# Patient Record
Sex: Male | Born: 1935 | Race: Black or African American | Hispanic: No | Marital: Single
Health system: Southern US, Community
[De-identification: ages and names within clinical notes are randomized; demographics above are authoritative.]

## PROBLEM LIST (undated history)

## (undated) DIAGNOSIS — R55 Syncope and collapse: Secondary | ICD-10-CM

## (undated) DIAGNOSIS — I639 Cerebral infarction, unspecified: Secondary | ICD-10-CM

## (undated) DIAGNOSIS — F0280 Dementia in other diseases classified elsewhere without behavioral disturbance: Secondary | ICD-10-CM

---

## 2019-09-28 ENCOUNTER — Inpatient Hospital Stay (HOSPITAL_COMMUNITY)
Admission: EM | Admit: 2019-09-28 | Discharge: 2019-10-02 | DRG: 683 | Disposition: A | Discharge status: Home Health | Payer: Medicare Other | Attending: Internal Medicine | Admitting: Internal Medicine

## 2019-09-28 ENCOUNTER — Inpatient Hospital Stay (HOSPITAL_COMMUNITY): Payer: Medicare Other

## 2019-09-28 ENCOUNTER — Encounter (HOSPITAL_COMMUNITY): Payer: Self-pay | Admitting: Emergency Medicine

## 2019-09-28 ENCOUNTER — Other Ambulatory Visit: Payer: Self-pay

## 2019-09-28 DIAGNOSIS — R197 Diarrhea, unspecified: Secondary | ICD-10-CM | POA: Diagnosis present

## 2019-09-28 DIAGNOSIS — F0280 Dementia in other diseases classified elsewhere without behavioral disturbance: Secondary | ICD-10-CM | POA: Diagnosis present

## 2019-09-28 DIAGNOSIS — E860 Dehydration: Secondary | ICD-10-CM | POA: Diagnosis present

## 2019-09-28 DIAGNOSIS — I69351 Hemiplegia and hemiparesis following cerebral infarction affecting right dominant side: Secondary | ICD-10-CM

## 2019-09-28 DIAGNOSIS — G309 Alzheimer's disease, unspecified: Secondary | ICD-10-CM | POA: Diagnosis present

## 2019-09-28 DIAGNOSIS — Z20822 Contact with and (suspected) exposure to covid-19: Secondary | ICD-10-CM | POA: Diagnosis present

## 2019-09-28 DIAGNOSIS — N281 Cyst of kidney, acquired: Secondary | ICD-10-CM | POA: Diagnosis present

## 2019-09-28 DIAGNOSIS — R509 Fever, unspecified: Secondary | ICD-10-CM | POA: Diagnosis not present

## 2019-09-28 DIAGNOSIS — N390 Urinary tract infection, site not specified: Secondary | ICD-10-CM | POA: Diagnosis present

## 2019-09-28 DIAGNOSIS — N1832 Chronic kidney disease, stage 3b: Secondary | ICD-10-CM | POA: Diagnosis present

## 2019-09-28 DIAGNOSIS — R55 Syncope and collapse: Secondary | ICD-10-CM | POA: Diagnosis present

## 2019-09-28 DIAGNOSIS — E870 Hyperosmolality and hypernatremia: Secondary | ICD-10-CM | POA: Diagnosis present

## 2019-09-28 DIAGNOSIS — N179 Acute kidney failure, unspecified: Secondary | ICD-10-CM | POA: Diagnosis present

## 2019-09-28 DIAGNOSIS — E872 Acidosis: Secondary | ICD-10-CM | POA: Diagnosis present

## 2019-09-28 DIAGNOSIS — B964 Proteus (mirabilis) (morganii) as the cause of diseases classified elsewhere: Secondary | ICD-10-CM | POA: Diagnosis present

## 2019-09-28 DIAGNOSIS — E876 Hypokalemia: Secondary | ICD-10-CM | POA: Diagnosis present

## 2019-09-28 DIAGNOSIS — D649 Anemia, unspecified: Secondary | ICD-10-CM | POA: Diagnosis not present

## 2019-09-28 HISTORY — DX: Syncope and collapse: R55

## 2019-09-28 HISTORY — DX: Dementia in other diseases classified elsewhere, unspecified severity, without behavioral disturbance, psychotic disturbance, mood disturbance, and anxiety: F02.80

## 2019-09-28 HISTORY — DX: Cerebral infarction, unspecified: I63.9

## 2019-09-28 LAB — BASIC METABOLIC PANEL
Anion gap: 13 (ref 5–15)
BUN: 40 mg/dL — ABNORMAL HIGH (ref 8–23)
BUN: 41 mg/dL — ABNORMAL HIGH (ref 8–23)
CO2: 26 mmol/L (ref 22–32)
CO2: 21 mmol/L — ABNORMAL LOW (ref 22–32)
Calcium: 9.2 mg/dL (ref 8.9–10.3)
Calcium: 8.9 mg/dL (ref 8.9–10.3)
Chloride: >130 mmol/L (ref 98–111)
Chloride: 112 mmol/L — ABNORMAL HIGH (ref 98–111)
Creatinine, Ser: 2.31 mg/dL — ABNORMAL HIGH (ref 0.61–1.24)
Creatinine, Ser: 2.39 mg/dL — ABNORMAL HIGH (ref 0.61–1.24)
GFR calc Af Amer: 27 mL/min — ABNORMAL LOW (ref >60)
GFR calc Af Amer: 26 mL/min — ABNORMAL LOW (ref >60)
GFR calc non Af Amer: 23 mL/min — ABNORMAL LOW (ref >60)
GFR calc non Af Amer: 23 mL/min — ABNORMAL LOW (ref >60)
Glucose, Bld: 126 mg/dL — ABNORMAL HIGH (ref 70–99)
Glucose, Bld: 111 mg/dL — ABNORMAL HIGH (ref 70–99)
Potassium: 4.8 mmol/L (ref 3.5–5.1)
Potassium: 4.7 mmol/L (ref 3.5–5.1)
Sodium: 174 mmol/L (ref 135–145)
Sodium: 146 mmol/L — ABNORMAL HIGH (ref 135–145)

## 2019-09-28 LAB — CBC WITH DIFFERENTIAL/PLATELET
Abs Immature Granulocytes: 0.05 10*3/uL (ref 0.00–0.07)
Basophils Absolute: 0 10*3/uL (ref 0.0–0.1)
Basophils Relative: 0 %
Eosinophils Absolute: 0 10*3/uL (ref 0.0–0.5)
Eosinophils Relative: 0 %
HCT: 34.9 % — ABNORMAL LOW (ref 39.0–52.0)
Hemoglobin: 11.2 g/dL — ABNORMAL LOW (ref 13.0–17.0)
Immature Granulocytes: 0 %
Lymphocytes Relative: 8 %
Lymphs Abs: 0.9 10*3/uL (ref 0.7–4.0)
MCH: 31.4 pg (ref 26.0–34.0)
MCHC: 32.1 g/dL (ref 30.0–36.0)
MCV: 97.8 fL (ref 80.0–100.0)
Monocytes Absolute: 0.6 10*3/uL (ref 0.1–1.0)
Monocytes Relative: 5 %
Neutro Abs: 10.4 10*3/uL — ABNORMAL HIGH (ref 1.7–7.7)
Neutrophils Relative %: 87 %
Platelets: 251 10*3/uL (ref 150–400)
RBC: 3.57 MIL/uL — ABNORMAL LOW (ref 4.22–5.81)
RDW: 12.6 % (ref 11.5–15.5)
WBC: 12 10*3/uL — ABNORMAL HIGH (ref 4.0–10.5)
nRBC: 0 % (ref 0.0–0.2)

## 2019-09-28 LAB — RESPIRATORY PANEL BY RT PCR (FLU A&B, COVID)
Influenza A by PCR: NEGATIVE
Influenza B by PCR: NEGATIVE
SARS Coronavirus 2 by RT PCR: NEGATIVE

## 2019-09-28 LAB — GLUCOSE, CAPILLARY
Glucose-Capillary: 86 mg/dL (ref 70–99)
Glucose-Capillary: 92 mg/dL (ref 70–99)

## 2019-09-28 MED ORDER — SODIUM CHLORIDE 0.9 % IV BOLUS
500.0000 mL | Freq: Once | INTRAVENOUS | Status: AC
  Administered 2019-09-28: 500 mL via INTRAVENOUS

## 2019-09-28 MED ORDER — ACETAMINOPHEN 650 MG RE SUPP
650.0000 mg | Freq: Four times a day (QID) | RECTAL | Status: DC | PRN
  Administered 2019-09-29: 650 mg via RECTAL
  Filled 2019-09-28: qty 1

## 2019-09-28 MED ORDER — SODIUM CHLORIDE 0.9% FLUSH
3.0000 mL | Freq: Two times a day (BID) | INTRAVENOUS | Status: DC
  Administered 2019-09-30 – 2019-10-01 (×4): 3 mL via INTRAVENOUS

## 2019-09-28 MED ORDER — SODIUM CHLORIDE 0.9 % IV SOLN: INTRAVENOUS | Status: DC

## 2019-09-28 MED ORDER — ACETAMINOPHEN 325 MG PO TABS: 650.0000 mg | ORAL_TABLET | Freq: Four times a day (QID) | ORAL | Status: DC | PRN

## 2019-09-28 MED ORDER — HEPARIN SODIUM (PORCINE) 5000 UNIT/ML IJ SOLN: 5000.0000 [IU] | Freq: Three times a day (TID) | INTRAMUSCULAR | Status: DC

## 2019-09-28 MED ORDER — HEPARIN SODIUM (PORCINE) 5000 UNIT/ML IJ SOLN
5000.0000 [IU] | Freq: Two times a day (BID) | INTRAMUSCULAR | Status: DC
  Administered 2019-09-28 – 2019-10-02 (×8): 5000 [IU] via SUBCUTANEOUS
  Filled 2019-09-28 (×8): qty 1

## 2019-09-28 NOTE — ED Triage Notes (Signed)
Per [facility] pts family reports patient had syncopal episode after having Covid vaccine. Family states patient was unresponsive for about 20 mins and that pt has hx vagal maneuver and has happened in past. When EMS arrived patient was alert. Hx of stroke with right sided deficits and speech issues. Patient has memory impairment and says "yes" to every question asked.

## 2019-09-28 NOTE — ED Notes (Signed)
ED TO INPATIENT HANDOFF REPORT  ED Nurse Name and Phone #: Florentina Addison [phone]  S Name/Age/Gender Rodney Perkins 86 y.o. male Room/Bed: 034C/034C  Code Status   Code Status: Full Code  Home/SNF/Other Home Patient oriented to: self Is this baseline? Yes   Triage Complete: Triage complete  Chief Complaint AKI (acute kidney injury) (HCC) [N17.9] Syncope [R55]  Triage Note Per [facility] pts family reports patient had syncopal episode after having Covid vaccine. Family states patient was unresponsive for about 20 mins and that pt has hx vagal maneuver and has happened in past. When EMS arrived patient was alert. Hx of stroke with right sided deficits and speech issues. Patient has memory impairment and says "yes" to every question asked.     Allergies No Known Allergies  Level of Care/Admitting Diagnosis ED Disposition    ED Disposition Condition Comment   Admit  Hospital Area: [facility] [[id number]]  Level of Care: Telemetry Medical [104]  May admit patient to [facility] or [facility] if equivalent level of care is available:: Yes  Covid Evaluation: Asymptomatic Screening Protocol (No Symptoms)  Diagnosis: Syncope [206001]  Admitting Physician: Emeline General [[id number]]  Attending Physician: Emeline General [[id number]]  Estimated length of stay: past midnight tomorrow  Certification:: I certify this patient will need inpatient services for at least 2 midnights       B Medical/Surgery History Past Medical History:  Diagnosis Date  . Alzheimer's dementia (HCC)   . Stroke (HCC)   . Vagal reaction      A IV Location/Drains/Wounds Patient Lines/Drains/Airways Status   Active Line/Drains/Airways    Name:   Placement date:   Placement time:   Site:   Days:   Peripheral IV 09/28/19 Right Hand   09/28/19    1156    Hand   less than 1          Intake/Output Last 24 hours  Intake/Output Summary (Last 24 hours) at 09/28/2019 1806 Last data filed at 09/28/2019  1410 Gross per 24 hour  Intake 500 ml  Output --  Net 500 ml    Labs/Imaging Results for orders placed or performed during the hospital encounter of 09/28/19 (from the past 48 hour(s))  Basic metabolic panel     Status: Abnormal   Collection Time: 09/28/19 11:22 AM  Result Value Ref Range   Sodium 174 (HH) 135 - 145 mmol/L    Comment: CRITICAL RESULT CALLED TO, READ BACK BY AND VERIFIED WITH: K.RAND,RN 1238 09/28/2019 CLARK,S    Potassium 4.8 3.5 - 5.1 mmol/L   Chloride >130 (HH) 98 - 111 mmol/L    Comment: CRITICAL RESULT CALLED TO, READ BACK BY AND VERIFIED WITH: K.RAND,RN 1238 09/28/2019 CLARK,S    CO2 26 22 - 32 mmol/L   Glucose, Bld 126 (H) 70 - 99 mg/dL   BUN 40 (H) 8 - 23 mg/dL   Creatinine, Ser 0.27 (H) 0.61 - 1.24 mg/dL   Calcium 9.2 8.9 - 74.1 mg/dL   GFR calc non Af Amer 23 (L) >60 mL/min   GFR calc Af Amer 27 (L) >60 mL/min   Anion gap NOT CALCULATED 5 - 15    Comment: Performed at [facility], [address] [place], [place] [address]  Basic metabolic panel     Status: Abnormal   Collection Time: 09/28/19  1:35 PM  Result Value Ref Range   Sodium 146 (H) 135 - 145 mmol/L    Comment: DELTA CHECK NOTED  Potassium 4.7 3.5 - 5.1 mmol/L   Chloride 112 (H) 98 - 111 mmol/L   CO2 21 (L) 22 - 32 mmol/L   Glucose, Bld 111 (H) 70 - 99 mg/dL   BUN 41 (H) 8 - 23 mg/dL   Creatinine, Ser 1.59 (H) 0.61 - 1.24 mg/dL   Calcium 8.9 8.9 - 45.8 mg/dL   GFR calc non Af Amer 23 (L) >60 mL/min   GFR calc Af Amer 26 (L) >60 mL/min   Anion gap 13 5 - 15    Comment: Performed at [facility], [address] [place], [place] [address]  CBC with Differential     Status: Abnormal   Collection Time: 09/28/19  1:37 PM  Result Value Ref Range   WBC 12.0 (H) 4.0 - 10.5 K/uL   RBC 3.57 (L) 4.22 - 5.81 MIL/uL   Hemoglobin 11.2 (L) 13.0 - 17.0 g/dL   HCT 44.6 (L) 28.6 - 38.1 %   MCV 97.8 80.0 - 100.0 fL   MCH 31.4 26.0 - 34.0 pg   MCHC 32.1 30.0 - 36.0 g/dL   RDW 77.1  16.5 - 79.0 %   Platelets 251 150 - 400 K/uL   nRBC 0.0 0.0 - 0.2 %   Neutrophils Relative % 87 %   Neutro Abs 10.4 (H) 1.7 - 7.7 K/uL   Lymphocytes Relative 8 %   Lymphs Abs 0.9 0.7 - 4.0 K/uL   Monocytes Relative 5 %   Monocytes Absolute 0.6 0.1 - 1.0 K/uL   Eosinophils Relative 0 %   Eosinophils Absolute 0.0 0.0 - 0.5 K/uL   Basophils Relative 0 %   Basophils Absolute 0.0 0.0 - 0.1 K/uL   Immature Granulocytes 0 %   Abs Immature Granulocytes 0.05 0.00 - 0.07 K/uL    Comment: Performed at [facility], [address] [place], [place] [address]   X-ray chest PA and lateral  Result Date: 09/28/2019 CLINICAL DATA:  Syncopal episode after receiving a COVID vaccine today. EXAM: CHEST - 2 VIEW COMPARISON:  None. FINDINGS: The lungs are clear. Heart size is normal. No pneumothorax or pleural fluid. No acute or focal bony abnormality. IMPRESSION: Negative chest. Electronically Signed   By: Drusilla Kanner M.D.   On: 09/28/2019 17:50    Pending Labs Unresulted Labs (From admission, onward)    Start     Ordered   09/29/19 0500  Basic metabolic panel  Tomorrow morning,   R     09/28/19 1728   09/28/19 1548  Respiratory Panel by RT PCR (Flu A&B, Covid) - Nasopharyngeal Swab  (Asymptomatic Respiratory Panel by RT PCR (Flu A&B, Covid))  Once,   STAT    Question Answer Comment  Is this test for diagnosis or screening Screening   Symptomatic for COVID-19 as defined by CDC No   Hospitalized for COVID-19 No   Admitted to ICU for COVID-19 No   Previously tested for COVID-19 No   Resident in a congregate (group) care setting No   Employed in healthcare setting No      09/28/19 1547   09/28/19 1122  CBC with Differential  Once,   STAT     09/28/19 1121   09/28/19 1122  Urinalysis, Routine w reflex microscopic  ONCE - STAT,   STAT     09/28/19 1121          Vitals/Pain Today's Vitals   09/28/19 1530 09/28/19 1545 09/28/19 1615 09/28/19 1645  BP: (!) 131/98 Marland Kitchen)  137/57 (!) 139/50  118/71  Pulse: 98 98 95 (!) 102  Resp: 14 15 15  (!) 25  Temp:      TempSrc:      SpO2: 95% 97% 97% 98%  PainSc:        Isolation Precautions No active isolations  Medications Medications  sodium chloride 0.9 % bolus 500 mL (has no administration in time range)  acetaminophen (TYLENOL) tablet 650 mg (has no administration in time range)    Or  acetaminophen (TYLENOL) suppository 650 mg (has no administration in time range)  sodium chloride flush (NS) 0.9 % injection 3 mL (has no administration in time range)  heparin injection 5,000 Units (has no administration in time range)  heparin injection 5,000 Units (has no administration in time range)  0.9 %  sodium chloride infusion (has no administration in time range)  sodium chloride 0.9 % bolus 500 mL (0 mLs Intravenous Stopped 09/28/19 1410)    Mobility walks with device     R Recommendations: See Admitting Provider Note  Report given to:

## 2019-09-28 NOTE — ED Provider Notes (Signed)
[facility] EMERGENCY DEPARTMENT Provider Note   CSN: [id number] Arrival date & time: 09/28/19  1101     History Chief Complaint  Patient presents with  . Loss of Consciousness    Rodney Perkins is a 86 y.o. male.  Rodney Perkins is a 86 y.o. male with history of stroke, Alzheimer's, vasovagal syncope, who presents to the emergency department via EMS after a syncopal episode.  Patient was receiving his second Covid vaccine at a clinic and after waiting is 15 minutes when he went to stand up he had a syncopal episode.  Family estimates that it took him 15 to 20 minutes to return to baseline after syncope, but on EMS arrival patient was alert and responsive.  Family reports history of since the last one was about a month and a half ago.  She does state that he usually comes around a bit faster, and 5 to 10 minutes.  Patient is verbally responsive but with dementia he answers yes to every question and is able to provide limited history.  Patient's daughter arrives at bedside and states that over the past few days he has not gotten much rest, but is at his baseline mental status.  Daughter reports that he had an episode of diarrhea this morning which is not uncommon for him but often makes him exhausted throughout the day, he has not had any fevers, chest pain or cough.  Most of his other family members have already been vaccinated for Covid.  She does express concern for possible UTI.  Reports she recently became his primary caregiver after her mother passed away and he moved here from [place], she does not have his previous medical records available.        Past Medical History:  Diagnosis Date  . Alzheimer's dementia (HCC)   . Stroke (HCC)   . Vagal reaction     There are no problems to display for this patient.     No family history on file.  Social History   Tobacco Use  . Smoking status: Not on file  Substance Use Topics  . Alcohol use: Not on file  . Drug  use: Not on file    Home Medications Prior to Admission medications   Not on File    Allergies    Patient has no allergy information on record.  Review of Systems   Review of Systems  Unable to perform ROS: Dementia    Physical Exam Updated Vital Signs BP 117/73   Pulse (!) 103   Temp 98.4 F (36.9 C) (Oral)   Resp 14   SpO2 98%   Physical Exam Vitals and nursing note reviewed.  Constitutional:      General: He is not in acute distress.    Appearance: He is well-developed. He is not diaphoretic.     Comments: Chronically ill-appearing elderly male, in no acute distress  HENT:     Head: Normocephalic and atraumatic.     Mouth/Throat:     Comments: Mucous membranes slightly dry Eyes:     General:        Right eye: No discharge.        Left eye: No discharge.  Cardiovascular:     Rate and Rhythm: Regular rhythm. Tachycardia present.     Heart sounds: Normal heart sounds.     Comments: Mild tachycardia with regular rhythm Pulmonary:     Effort: Pulmonary effort is normal. No respiratory distress.     Breath sounds:  Normal breath sounds. No wheezing or rales.     Comments: Respirations equal and unlabored, patient able to speak in full sentences, lungs clear to auscultation bilaterally Abdominal:     General: Bowel sounds are normal. There is no distension.     Palpations: Abdomen is soft. There is no mass.     Tenderness: There is no abdominal tenderness. There is no guarding.     Comments: Abdomen soft, nondistended, nontender to palpation in all quadrants without guarding or peritoneal signs  Musculoskeletal:        General: No deformity.     Cervical back: Neck supple.  Skin:    General: Skin is warm and dry.     Capillary Refill: Capillary refill takes less than 2 seconds.  Neurological:     Mental Status: He is alert.     Coordination: Coordination normal.     Comments: Patient answers yes to all questions, able to follow some commands Chronic  right-sided deficits noted with contracture, able to move left extremities without difficulty  Psychiatric:        Mood and Affect: Mood normal.        Behavior: Behavior normal.     ED Results / Procedures / Treatments   Labs (all labs ordered are listed, but only abnormal results are displayed) Labs Reviewed  BASIC METABOLIC PANEL - Abnormal; Notable for the following components:      Result Value   Sodium 174 (*)    Chloride >130 (*)    Glucose, Bld 126 (*)    BUN 40 (*)    Creatinine, Ser 2.31 (*)    GFR calc non Af Amer 23 (*)    GFR calc Af Amer 27 (*)    All other components within normal limits  BASIC METABOLIC PANEL - Abnormal; Notable for the following components:   Sodium 146 (*)    Chloride 112 (*)    CO2 21 (*)    Glucose, Bld 111 (*)    BUN 41 (*)    Creatinine, Ser 2.39 (*)    GFR calc non Af Amer 23 (*)    GFR calc Af Amer 26 (*)    All other components within normal limits  CBC WITH DIFFERENTIAL/PLATELET - Abnormal; Notable for the following components:   WBC 12.0 (*)    RBC 3.57 (*)    Hemoglobin 11.2 (*)    HCT 34.9 (*)    Neutro Abs 10.4 (*)    All other components within normal limits  RESPIRATORY PANEL BY RT PCR (FLU A&B, COVID)  CBC WITH DIFFERENTIAL/PLATELET  URINALYSIS, ROUTINE W REFLEX MICROSCOPIC    EKG None  Radiology No results found.  Procedures Procedures (including critical care time)  Medications Ordered in ED Medications  sodium chloride 0.9 % bolus 500 mL (has no administration in time range)  0.9 %  sodium chloride infusion (has no administration in time range)  sodium chloride 0.9 % bolus 500 mL (0 mLs Intravenous Stopped 09/28/19 1410)    ED Course  I have reviewed the triage vital signs and the nursing notes.  Pertinent labs & imaging results that were available during my care of the patient were reviewed by me and considered in my medical decision making (see chart for details).    MDM Rules/Calculators/A&P                      86 year old male presents via EMS after he had a syncopal episode after  receiving his second Covid vaccine.  History of vasovagal syncope, family member reports it took him about 20 minutes to come back to baseline but when EMS arrived patient was alert and responsive.  At baseline he is able to answer yes to questions but is not oriented to self or situation.  He is overall well-appearing and daughter reports he is at his baseline, he did have some diarrhea this morning and after this she reports he is often exhausted.  She states he has not slept much over the past 3 days.  She is concerned that he has not been eating and drinking as much and that he may be dehydrated.  A few months ago her mother passed away and she became the primary caregiver of the patient, he moved here from [facility].  On arrival patient is alert and responsive.  Mildly tachycardic on arrival but vitals otherwise normal.  Will check basic labs, urinalysis and EKG.  Suspect vasovagal syncope after vaccination, but also concern for possible dehydration.  Patient is afebrile, unable to report any infectious symptoms.   EKG shows sinus tachycardia with baseline artifact, no significant arrhythmia or ischemic changes noted.  Labs show mild leukocytosis of 12, stable hemoglobin.  Initial BMP shows sodium of 174 and chloride greater than 130, concern for lab error, will recollect.  Repeat BMP shows sodium of 146 and chloride of 112, which is much more in line with what I would expect.  Glucose of 111.  Creatinine of 2.39, no baseline available for comparison, this may be chronic versus AKI.  Will attempt to speak with family to obtain patient's baseline kidney function.  IV fluids given.  No prior records available in the medical chart or through care everywhere.  The patient's daughter states that he used to receive his primary care at [facility] primary care in [place] with Dr. Minette Headland, I contacted their office  and was able to speak to a staff member who told me the patient's most recent creatinine in August 2019 was 1.6, we do not have a more recent lab value available, patient's creatinine today is 2.39, this could be more chronic but could also be AKI, I had a discussion with the patient's daughter about this and she would prefer to bring him in for IV rehydration to keep a close eye on his kidney function, she also reports patient has a history of multiple UTIs and she is concerned he could have one currently.  Consult to medicine for admission place.  I discussed case with Dr. Roosevelt Locks with [facility] hospital[facility] who will see and admit the patient.   Final Clinical Impression(s) / ED Diagnoses Final diagnoses:  Syncope, unspecified syncope type  AKI (acute kidney injury) [facility])    Rx / DC Orders ED Discharge Orders    None       Jacqlyn Larsen, [place] 09/28/19 1717    Lucrezia Starch, MD 10/01/19 [phone]

## 2019-09-28 NOTE — H&P (Signed)
History and Physical    Rodney Perkins YSA:[id number] DOB: May 16, 1924 DOA: 09/28/2019  PCP: Patient, No Pcp Per   Patient coming from: Home  I have personally briefly reviewed patient's old medical records in [facility]  Chief Complaint: Passing out  HPI: Rodney Perkins is a 86 y.o. male with medical history significant of stroke, Alzheimer's, vasovagal syncope, who presents to ER for evaluation of one syncopal episode.  Patient was receiving his second Covid vaccine at a clinic and after waiting is 15 minutes when he went to stand up he had a syncopal episode.  Daughter who was with the patient at the inoculation station remembered that him 15 to 20 minutes to return to baseline. EMS found patient was alert and responsive.  Daughter reported one other episode a month ago.  Patient has advanced dementia thus his daughter provided the rest of the history, who reported that patient has not been sleeping well for 2 nights, and this morning pt had two large loose diarrhea which filled up his diaper and this happened before pt went to get the vaccine.   ED Course: Sleepy and blood work found AKI. Also has tachycardia. 500 ML NS bolus x2.  Review of Systems: Unable to perform, pt non-verbal  Past Medical History:  Diagnosis Date  . Alzheimer's dementia (HCC)   . Stroke (HCC)   . Vagal reaction       has no history on file for tobacco, alcohol, and drug.  No Known Allergies  No family history on file.   Prior to Admission medications   Medication Sig Start Date End Date Taking? Authorizing Provider  acetaminophen (TYLENOL) 500 MG tablet Take 1,000 mg by mouth as needed for mild pain, moderate pain, fever or headache.   Yes [provider]  aspirin 81 MG chewable tablet Chew 81 mg by mouth daily.   Yes [provider]  Cyanocobalamin (VITAMIN B12 PO) Take 1 tablet by mouth daily.   Yes [provider]  Multiple Vitamin (MULTIVITAMIN ADULT PO) Take 1 tablet by  mouth daily.   Yes [provider]    Physical Exam: Vitals:   09/28/19 1530 09/28/19 1545 09/28/19 1615 09/28/19 1645  BP: (!) 131/98 (!) 137/57 (!) 139/50 118/71  Pulse: 98 98 95 (!) 102  Resp: 14 15 15  (!) 25  Temp:      TempSrc:      SpO2: 95% 97% 97% 98%    Constitutional: NAD, calm, comfortable Vitals:   09/28/19 1530 09/28/19 1545 09/28/19 1615 09/28/19 1645  BP: (!) 131/98 (!) 137/57 (!) 139/50 118/71  Pulse: 98 98 95 (!) 102  Resp: 14 15 15  (!) 25  Temp:      TempSrc:      SpO2: 95% 97% 97% 98%   Eyes: PERRL, lids and conjunctivae normal ENMT: Mucous membranes are dry. Posterior pharynx clear of any exudate or lesions.Normal dentition.  Neck: normal, supple, no masses, no thyromegaly Respiratory: clear to auscultation bilaterally, no wheezing, no crackles. Normal respiratory effort. No accessory muscle use.  Cardiovascular: Regular rate and rhythm, no murmurs / rubs / gallops. No extremity edema. 2+ pedal pulses. No carotid bruits.  Abdomen: no tenderness, no masses palpated. No hepatosplenomegaly. Bowel sounds positive.  Musculoskeletal: no clubbing / cyanosis. No joint deformity upper and lower extremities. Good ROM, no contractures. Normal muscle tone.  Skin: no rashes, lesions, ulcers. No induration Neurologic: Arousable, chronic right sided weakness  Psychiatric: Calm but sleepy.    Labs on  Admission: I have personally reviewed following labs and imaging studies  CBC: Recent Labs  Lab 09/28/19 1337  WBC 12.0*  NEUTROABS 10.4*  HGB 11.2*  HCT 34.9*  MCV 97.8  PLT 102   Basic Metabolic Panel: Recent Labs  Lab 09/28/19 1122 09/28/19 1335  NA 174* 146*  K 4.8 4.7  CL >130* 112*  CO2 26 21*  GLUCOSE 126* 111*  BUN 40* 41*  CREATININE 2.31* 2.39*  CALCIUM 9.2 8.9   GFR: CrCl cannot be calculated (Unknown ideal weight.). Liver Function Tests: No results for input(s): AST, ALT, ALKPHOS, BILITOT, PROT, ALBUMIN in the last 168 hours. No  results for input(s): LIPASE, AMYLASE in the last 168 hours. No results for input(s): AMMONIA in the last 168 hours. Coagulation Profile: No results for input(s): INR, PROTIME in the last 168 hours. Cardiac Enzymes: No results for input(s): CKTOTAL, CKMB, CKMBINDEX, TROPONINI in the last 168 hours. BNP (last 3 results) No results for input(s): PROBNP in the last 8760 hours. HbA1C: No results for input(s): HGBA1C in the last 72 hours. CBG: No results for input(s): GLUCAP in the last 168 hours. Lipid Profile: No results for input(s): CHOL, HDL, LDLCALC, TRIG, CHOLHDL, LDLDIRECT in the last 72 hours. Thyroid Function Tests: No results for input(s): TSH, T4TOTAL, FREET4, T3FREE, THYROIDAB in the last 72 hours. Anemia Panel: No results for input(s): VITAMINB12, FOLATE, FERRITIN, TIBC, IRON, RETICCTPCT in the last 72 hours. Urine analysis: No results found for: COLORURINE, APPEARANCEUR, LABSPEC, PHURINE, GLUCOSEU, HGBUR, BILIRUBINUR, KETONESUR, PROTEINUR, UROBILINOGEN, NITRITE, LEUKOCYTESUR  Radiological Exams on Admission: X-ray chest PA and lateral  Result Date: 09/28/2019 CLINICAL DATA:  Syncopal episode after receiving a COVID vaccine today. EXAM: CHEST - 2 VIEW COMPARISON:  None. FINDINGS: The lungs are clear. Heart size is normal. No pneumothorax or pleural fluid. No acute or focal bony abnormality. IMPRESSION: Negative chest. Electronically Signed   By: Rodney Perkins M.D.   On: 09/28/2019 17:50    EKG: Independently reviewed. Sinus tachy  Assessment/Plan Active Problems:   Syncope, vasovagal   AKI (acute kidney injury) ([place])   Syncope  Syncope Likely vasovagal Hypovolumic from sudden onset of diarrhea No more diarrhea since this morning Slow rate hydration for tonight Orthostatic vitals tomorrow PT evaluation. Family wants Full Code  AKI on CKD stage III Most recent Cre 1.6 about two years ago Again judged but history of large diarrhea and exam showing tachycardia  and dry mucous membrane, pt still hypovolumic, hydration tonight.  CVA with right sided weakness At his baseline  PT evaluation tomorrow.  Dementia Full Code for now.    DVT prophylaxis: Heparin subQ Code Status: Full Code Family Communication: Daughter at bedside Disposition Plan: PT evaluation, may need rehab Consults called: None Admission status: Tele admit   Lequita Halt MD [facility] Pager [phone]   09/28/2019, 6:11 PM

## 2019-09-29 ENCOUNTER — Inpatient Hospital Stay (HOSPITAL_COMMUNITY): Payer: Medicare Other

## 2019-09-29 DIAGNOSIS — E870 Hyperosmolality and hypernatremia: Secondary | ICD-10-CM

## 2019-09-29 DIAGNOSIS — R509 Fever, unspecified: Secondary | ICD-10-CM

## 2019-09-29 DIAGNOSIS — R55 Syncope and collapse: Secondary | ICD-10-CM

## 2019-09-29 LAB — BASIC METABOLIC PANEL
Anion gap: 9 (ref 5–15)
BUN: 33 mg/dL — ABNORMAL HIGH (ref 8–23)
CO2: 23 mmol/L (ref 22–32)
Calcium: 8.4 mg/dL — ABNORMAL LOW (ref 8.9–10.3)
Chloride: 114 mmol/L — ABNORMAL HIGH (ref 98–111)
Creatinine, Ser: 2.19 mg/dL — ABNORMAL HIGH (ref 0.61–1.24)
GFR calc Af Amer: 29 mL/min — ABNORMAL LOW (ref >60)
GFR calc non Af Amer: 25 mL/min — ABNORMAL LOW (ref >60)
Glucose, Bld: 96 mg/dL (ref 70–99)
Potassium: 3.7 mmol/L (ref 3.5–5.1)
Sodium: 146 mmol/L — ABNORMAL HIGH (ref 135–145)

## 2019-09-29 LAB — CBC WITH DIFFERENTIAL/PLATELET
Abs Immature Granulocytes: 0.02 10*3/uL (ref 0.00–0.07)
Basophils Absolute: 0 10*3/uL (ref 0.0–0.1)
Basophils Relative: 0 %
Eosinophils Absolute: 0.1 10*3/uL (ref 0.0–0.5)
Eosinophils Relative: 2 %
HCT: 29.6 % — ABNORMAL LOW (ref 39.0–52.0)
Hemoglobin: 9.5 g/dL — ABNORMAL LOW (ref 13.0–17.0)
Immature Granulocytes: 0 %
Lymphocytes Relative: 27 %
Lymphs Abs: 1.9 10*3/uL (ref 0.7–4.0)
MCH: 31.9 pg (ref 26.0–34.0)
MCHC: 32.1 g/dL (ref 30.0–36.0)
MCV: 99.3 fL (ref 80.0–100.0)
Monocytes Absolute: 0.6 10*3/uL (ref 0.1–1.0)
Monocytes Relative: 9 %
Neutro Abs: 4.4 10*3/uL (ref 1.7–7.7)
Neutrophils Relative %: 62 %
Platelets: 206 10*3/uL (ref 150–400)
RBC: 2.98 MIL/uL — ABNORMAL LOW (ref 4.22–5.81)
RDW: 12.8 % (ref 11.5–15.5)
WBC: 7 10*3/uL (ref 4.0–10.5)
nRBC: 0 % (ref 0.0–0.2)

## 2019-09-29 LAB — URINALYSIS, ROUTINE W REFLEX MICROSCOPIC
Bilirubin Urine: NEGATIVE
Glucose, UA: NEGATIVE mg/dL
Ketones, ur: NEGATIVE mg/dL
Nitrite: NEGATIVE
Protein, ur: NEGATIVE mg/dL
Specific Gravity, Urine: 1.013 (ref 1.005–1.030)
pH: 5 (ref 5.0–8.0)

## 2019-09-29 LAB — HEPATIC FUNCTION PANEL
ALT: 10 U/L (ref 0–44)
AST: 14 U/L — ABNORMAL LOW (ref 15–41)
Albumin: 2.8 g/dL — ABNORMAL LOW (ref 3.5–5.0)
Alkaline Phosphatase: 65 U/L (ref 38–126)
Bilirubin, Direct: 0.1 mg/dL (ref 0.0–0.2)
Indirect Bilirubin: 0.7 mg/dL (ref 0.3–0.9)
Total Bilirubin: 0.8 mg/dL (ref 0.3–1.2)
Total Protein: 5.5 g/dL — ABNORMAL LOW (ref 6.5–8.1)

## 2019-09-29 LAB — TROPONIN I (HIGH SENSITIVITY)
Troponin I (High Sensitivity): 23 ng/L — ABNORMAL HIGH (ref <18)
Troponin I (High Sensitivity): 21 ng/L — ABNORMAL HIGH (ref <18)

## 2019-09-29 LAB — MAGNESIUM: Magnesium: 1.9 mg/dL (ref 1.7–2.4)

## 2019-09-29 LAB — ECHOCARDIOGRAM COMPLETE

## 2019-09-29 LAB — GLUCOSE, CAPILLARY: Glucose-Capillary: 77 mg/dL (ref 70–99)

## 2019-09-29 LAB — PHOSPHORUS: Phosphorus: 3.8 mg/dL (ref 2.5–4.6)

## 2019-09-29 MED ORDER — DEXTROSE 5 % IV SOLN: INTRAVENOUS | Status: DC

## 2019-09-29 NOTE — Progress Notes (Signed)
  Echocardiogram 2D Echocardiogram has been performed.  Rodney Perkins 09/29/2019, 8:45 AM

## 2019-09-29 NOTE — Progress Notes (Signed)
New Admission Note: ? Arrival Method: via stretcher  Mental Orientation: Alert to self Telemetry: Box #8 NSR Assessment: Completed Skin: Refer to flowsheet IV: Right hand IV infusing  Pain: 0/10 Tubes: Safety Measures: Safety Fall Prevention Plan discussed with patient. Admission: Completed 5 [place] Orientation: Patient has been orientated to the room, unit and the staff.  Orders have been reviewed and are being implemented. Will continue to monitor the patient. Call light has been placed within reach and bed alarm has been activated.  ? [employer], RN [phone]

## 2019-09-29 NOTE — Plan of Care (Signed)
  Problem: Clinical Measurements: Goal: Ability to maintain clinical measurements within normal limits will improve Outcome: Progressing   

## 2019-09-29 NOTE — Progress Notes (Signed)
PROGRESS NOTE    Rodney Perkins  ZTI:[id number] DOB: [birth year] DOA: 09/28/2019 PCP: Patient, No Pcp Per   Brief Narrative:  HPI per Dr. Wynetta Fines on 09/28/2019 Rodney Perkins is a 86 y.o. male with medical history significant of stroke, Alzheimer's, vasovagal syncope, who presents to ER for evaluation of one syncopal episode. Patient was receiving his second Covid vaccine at a clinic and after waiting is 15 minutes when he went to stand up he had a syncopal episode. Daughter who was with the patient at the inoculation station remembered that him 15 to 20 minutes to return to baseline. EMS found patient was alert and responsive. Daughter reported one other episode a month ago. Patient has advanced dementia thus his daughter provided the rest of the history, who reported that patient has not been sleeping well for 2 nights, and this morning pt had two large loose diarrhea which filled up his diaper and this happened before pt went to get the vaccine.  ED Course: Sleepy and blood work found AKI. Also has tachycardia. 500 ML NS bolus x2.  Review of Systems: Unable to perform, pt non-verbal  **Interim History Patient was somnolent during my examination did not really wake up but then woke up her daughter briefly and would open his eyes and mumble something to her.  Renal function is trending downwards however later on this evening he had a temperature of 101.4 we will continue work-up.  He has been having some diarrhea so we will send for C. difficile as he had some loose stools prior to admission  Assessment & Plan:   Active Problems:   Syncope, vasovagal   AKI (acute kidney injury) ([place])   Syncope  Syncope -Likely vasovagal from Dehydration -He has been hypokalemic from sudden onset of diarrhea and had significant loose stools prior to his Vaccine  -C/w IVF Hydration with NS but will change to D5W given Hypernatremia -Repeat Orthostatic VS -Check ECHO, Head CT, Blood Cx, UA and Urine  Cx -UA showed moderate hemoglobin, trace leukocytes, rare bacteria, 6-10 WBCs and blood cultures and urine culture still pending -Head CT showed "No acute intracranial abnormality.  Old left posterior cerebral artery territory infarct and severe generalized atrophy." -ECHO showed EF of 70-75% with G1DD -Of note patient received his coronavirus vaccine yesterday and had a syncopal episode afterwards but SARS-CoV-2 testing by PCR negative -Check Cardiac Troponin I -PT/OT to Evaluate and treat -Family wants Full Code and states at baseline he ambulates from chair to bed and to the rest room and does talk sometimes  AKI on CKD stage III Metabolic acidosis -Most recent Cr 1.6 about two years ago -Again judged but history of large diarrhea and exam showing tachycardia and dry mucous membrane -C/w IVF Hydration with D5W at 75 mL/hr -Renal U/S showed "Echogenic renal parenchyma, suggesting medical renal disease. Bilateral renal cysts, including a suspected 5.6 cm right renal sinus cyst, although this is poorly distinguished from hydronephrosis on the provided study. Mild associated right hydronephrosis is suspected." -Continue to bladder scan and if patient's is retaining then will place Foley catheter -Patient's BUN/creatinine went from 41/2.39 is now 33/2.19 -Avoid nephrotoxic medications, contrast dyes, hypotension and renally adjust medication -Repeat CMP in a.m.  Fever -Spiked a Temperature of 101.4 this Evening and ? UTI vs. Community Acquired C Difficile -Has had Water Stools pTA and Syncopal Episode -UA showed moderate hemoglobin, trace leukocytes, rare bacteria, 6-10 WBCs and blood cultures and urine culture still pending -WBC on Admission was 12.0  and improved to 7.0 -Check Blood Cx x2 -Continue to Monitor Diarrheal Output and check C. difficile via PCR and placed on enteric precautions -Continue to monitor temperature curve and repeat CBC in a.m.  CVA with right sided weakness -C/w  PT/OT Evaluation.  Hypernatremia/Hyperchloremia -Patient sodium was 146 and chloride of 114 X-change IV fluid hydration as above and changed to D5W at 75 mils per hour -Continue monitor and trend and repeat CMP in a.m.  Normocytic Anemia -patient's hemoglobin/hematocrit from 11.2/34.9 is now 9.5/29.6 -Check anemia panel in the a.m. -Likely dilutional drop likely was hemoconcentrated on admission -Check FOBT -Continue to monitor for signs and symptoms of bleeding; currently no overt bleeding noted -Repeat CBC in a.m.  Dementia -Patient remains full code for now but can consider thyroid care consultation for goals of care discussion given the patient's frailty and current illness status  DVT prophylaxis: Heparin 5,000 unit sq q8h Code Status: FULL CODE  Family Communication: Discussed with daughter Jill Side at bedside  Disposition Plan: Pending further Workup   Consultants:   None  Procedures:  ECHOCARDIOGRAM IMPRESSIONS    1. No evidence of outflow tract gradient.  2. Left ventricular ejection fraction, by estimation, is 70 to 75%. The  left ventricle has hyperdynamic function. The left ventricle has no  regional wall motion abnormalities. There is mild left ventricular  hypertrophy. Left ventricular diastolic  parameters are consistent with Grade I diastolic dysfunction (impaired  relaxation).  3. Right ventricular systolic function is normal. The right ventricular  size is normal.  4. The mitral valve is normal in structure and function. No evidence of  mitral valve regurgitation. No evidence of mitral stenosis.  5. The aortic valve is normal in structure and function. Aortic valve  regurgitation is not visualized. Mild aortic valve sclerosis is present,  with no evidence of aortic valve stenosis.  6. The inferior vena cava is normal in size with greater than 50%  respiratory variability, suggesting right atrial pressure of 3 mmHg.   FINDINGS  Left Ventricle:  Left ventricular ejection fraction, by estimation, is 70  to 75%. The left ventricle has hyperdynamic function. The left ventricle  has no regional wall motion abnormalities. The left ventricular internal  cavity size was normal in size.  There is mild left ventricular hypertrophy. Left ventricular diastolic  parameters are consistent with Grade I diastolic dysfunction (impaired  relaxation).   Right Ventricle: The right ventricular size is normal. No increase in  right ventricular wall thickness. Right ventricular systolic function is  normal.   Left Atrium: Left atrial size was normal in size.   Right Atrium: Right atrial size was normal in size.   Pericardium: There is no evidence of pericardial effusion.   Mitral Valve: The mitral valve is normal in structure and function. Normal  mobility of the mitral valve leaflets. No evidence of mitral valve  regurgitation. No evidence of mitral valve stenosis.   Tricuspid Valve: The tricuspid valve is normal in structure. Tricuspid  valve regurgitation is not demonstrated. No evidence of tricuspid  stenosis.   Aortic Valve: The aortic valve is normal in structure and function. Aortic  valve regurgitation is not visualized. Mild aortic valve sclerosis is  present, with no evidence of aortic valve stenosis. Mild aortic valve  annular calcification.   Pulmonic Valve: The pulmonic valve was normal in structure. Pulmonic valve  regurgitation is not visualized. No evidence of pulmonic stenosis.   Aorta: The aortic root is normal in size and structure.  Venous: The inferior vena cava is normal in size with greater than 50%  respiratory variability, suggesting right atrial pressure of 3 mmHg.   IAS/Shunts: No atrial level shunt detected by color flow Doppler.   Additional Comments: No evidence of outflow tract gradient.     LEFT VENTRICLE  PLAX 2D  LVIDd:     3.86 cm Diastology  LVIDs:     2.90 cm LV e' lateral:  6.64  cm/s  LV PW:     1.33 cm LV E/e' lateral: 12.1  LV IVS:    1.14 cm LV e' medial:  4.68 cm/s  LVOT diam:   1.90 cm LV E/e' medial: 17.1  LV SV:     53.02 ml  LVOT Area:   2.84 cm     RIGHT VENTRICLE  RV Basal diam: 2.29 cm  RV S prime:   9.03 cm/s  TAPSE (M-mode): 2.4 cm   LEFT ATRIUM       RIGHT ATRIUM  LA diam:    4.20 cm RA Area:   13.90 cm  LA Vol (A2C):  25.9 ml RA Volume:  35.90 ml  LA Vol (A4C):  32.3 ml  LA Biplane Vol: 28.9 ml  AORTIC VALVE  LVOT Vmax:  92.70 cm/s  LVOT Vmean: 60.600 cm/s  LVOT VTI:  0.187 m    AORTA  Ao Root diam: 3.40 cm   MITRAL VALVE  MV Area (PHT): 4.21 cm  SHUNTS  MV Decel Time: 180 msec  Systemic VTI: 0.19 m  MV E velocity: 80.10 cm/s Systemic Diam: 1.90 cm  MV A velocity: 93.00 cm/s  MV E/A ratio: 0.86    Antimicrobials:  Anti-infectives (From admission, onward)   None     Subjective: Seen and examined at bedside and he was somnolent and drowsy and will wake up briefly but not back to sleep.  When daughter came in she was able to get him to arouse little bit more.  States that he was not happy that he had in and out catheter to collect urine in the ED.  Daughter states at baseline he ambulates from bed to the chair and chair to the bed and then also can ambulate to the bathroom but very short distances.  States that sometimes that he communicates with them and verbalizes and talks with them but becomes withdrawn and agrees angry or agitated.  Objective: Vitals:   09/28/19 1645 09/28/19 1937 09/28/19 2359 09/29/19 0348  BP: 118/71 (!) 129/58 (!) 117/49 (!) 122/54  Pulse: (!) 102 100 91 89  Resp: (!) 25 18 16 16   Temp:  98.8 F (37.1 C)  98.3 F (36.8 C)  TempSrc:  Oral  Oral  SpO2: 98% 99% 96% 100%    Intake/Output Summary (Last 24 hours) at 09/29/2019 0825 Last data filed at 09/29/2019 0801 Gross per 24 hour  Intake 1163.62 ml  Output 450 ml  Net 713.62 ml   There were  no vitals filed for this visit.  Examination: Physical Exam:  Constitutional: Thin frail elderly cachectic male currently in NAD and appears calm review I walked in Eyes: Lids and conjunctivae normal, sclerae anicteric  ENMT: External Ears, Nose appear normal. Grossly normal hearing.  Neck: Appears normal, supple, no cervical masses, normal ROM, no appreciable thyromegaly; no JVD Respiratory: Diminished to auscultation bilaterally, no wheezing, rales, rhonchi or crackles. Normal respiratory effort and patient is not tachypenic. No accessory muscle use.  Cardiovascular: RRR, no murmurs / rubs / gallops. S1 and S2  auscultated. No extremity edema.  Abdomen: Soft, non-tender, non-distended. Bowel sounds positive.  GU: Deferred. Musculoskeletal: No clubbing / cyanosis of digits/nails. No joint deformity upper and lower extremities.  Skin: No rashes, lesions, ulcers on a limited skin evaluation but daughter states he has a small sacral wound. No induration; Warm and dry.  Neurologic: Sleepy and withdrawn and does not answer communicate and does not participate in the neuro examination Psychiatric: Impaired judgment and insight.  Not alert and oriented x 3.  Appears depressed appearing with a flat affect   Data Reviewed: I have personally reviewed following labs and imaging studies  CBC: Recent Labs  Lab 09/28/19 1337  WBC 12.0*  NEUTROABS 10.4*  HGB 11.2*  HCT 34.9*  MCV 97.8  PLT 251   Basic Metabolic Panel: Recent Labs  Lab 09/28/19 1122 09/28/19 1335 09/29/19 0525  NA 174* 146* 146*  K 4.8 4.7 3.7  CL >130* 112* 114*  CO2 26 21* 23  GLUCOSE 126* 111* 96  BUN 40* 41* 33*  CREATININE 2.31* 2.39* 2.19*  CALCIUM 9.2 8.9 8.4*   GFR: CrCl cannot be calculated (Unknown ideal weight.). Liver Function Tests: No results for input(s): AST, ALT, ALKPHOS, BILITOT, PROT, ALBUMIN in the last 168 hours. No results for input(s): LIPASE, AMYLASE in the last 168 hours. No results for  input(s): AMMONIA in the last 168 hours. Coagulation Profile: No results for input(s): INR, PROTIME in the last 168 hours. Cardiac Enzymes: No results for input(s): CKTOTAL, CKMB, CKMBINDEX, TROPONINI in the last 168 hours. BNP (last 3 results) No results for input(s): PROBNP in the last 8760 hours. HbA1C: No results for input(s): HGBA1C in the last 72 hours. CBG: Recent Labs  Lab 09/28/19 1858 09/28/19 2152 09/29/19 0705  GLUCAP 86 92 77   Lipid Profile: No results for input(s): CHOL, HDL, LDLCALC, TRIG, CHOLHDL, LDLDIRECT in the last 72 hours. Thyroid Function Tests: No results for input(s): TSH, T4TOTAL, FREET4, T3FREE, THYROIDAB in the last 72 hours. Anemia Panel: No results for input(s): VITAMINB12, FOLATE, FERRITIN, TIBC, IRON, RETICCTPCT in the last 72 hours. Sepsis Labs: No results for input(s): PROCALCITON, LATICACIDVEN in the last 168 hours.  Recent Results (from the past 240 hour(s))  Respiratory Panel by RT PCR (Flu A&B, Covid) - Nasopharyngeal Swab     Status: None   Collection Time: 09/28/19  5:25 PM   Specimen: Nasopharyngeal Swab  Result Value Ref Range Status   SARS Coronavirus 2 by RT PCR NEGATIVE NEGATIVE Final    Comment: (NOTE) SARS-CoV-2 target nucleic acids are NOT DETECTED. The SARS-CoV-2 RNA is generally detectable in upper respiratoy specimens during the acute phase of infection. The lowest concentration of SARS-CoV-2 viral copies this assay can detect is 131 copies/mL. A negative result does not preclude SARS-Cov-2 infection and should not be used as the sole basis for treatment or other patient management decisions. A negative result may occur with  improper specimen collection/handling, submission of specimen other than nasopharyngeal swab, presence of viral mutation(s) within the areas targeted by this assay, and inadequate number of viral copies (<131 copies/mL). A negative result must be combined with clinical observations, patient history,  and epidemiological information. The expected result is Negative. Fact Sheet for Patients:  https://www.moore.com/ Fact Sheet for Healthcare Providers:  https://www.young.biz/ This test is not yet ap proved or cleared by the [place] FDA and  has been authorized for detection and/or diagnosis of SARS-CoV-2 by FDA under an Emergency Use Authorization (EUA). This EUA will remain  in effect (meaning this test can be used) for the duration of the COVID-19 declaration under Section 564(b)(1) of the Act, 21 U.S.C. section 360bbb-3(b)(1), unless the authorization is terminated or revoked sooner.    Influenza A by PCR NEGATIVE NEGATIVE Final   Influenza B by PCR NEGATIVE NEGATIVE Final    Comment: (NOTE) The Xpert Xpress SARS-CoV-2/FLU/RSV assay is intended as an aid in  the diagnosis of influenza from Nasopharyngeal swab specimens and  should not be used as a sole basis for treatment. Nasal washings and  aspirates are unacceptable for Xpert Xpress SARS-CoV-2/FLU/RSV  testing. Fact Sheet for Patients: https://www.moore.com/ Fact Sheet for Healthcare Providers: https://www.young.biz/ This test is not yet approved or cleared by the [place] FDA and  has been authorized for detection and/or diagnosis of SARS-CoV-2 by  FDA under an Emergency Use Authorization (EUA). This EUA will remain  in effect (meaning this test can be used) for the duration of the  Covid-19 declaration under Section 564(b)(1) of the Act, 21  U.S.C. section 360bbb-3(b)(1), unless the authorization is  terminated or revoked. Performed at [facility], [address] [place], [place] [address]      RN Pressure Injury Documentation:     There is no height or weight on file to calculate BMI.  Malnutrition Type:      Malnutrition Characteristics:      Nutrition Interventions:    Radiology Studies: X-ray chest PA  and lateral  Result Date: 09/28/2019 CLINICAL DATA:  Syncopal episode after receiving a COVID vaccine today. EXAM: CHEST - 2 VIEW COMPARISON:  None. FINDINGS: The lungs are clear. Heart size is normal. No pneumothorax or pleural fluid. No acute or focal bony abnormality. IMPRESSION: Negative chest. Electronically Signed   By: Drusilla Kanner M.D.   On: 09/28/2019 17:50   US RENAL  Result Date: 09/28/2019 CLINICAL DATA:  Acute kidney injury EXAM: RENAL / URINARY TRACT ULTRASOUND COMPLETE COMPARISON:  None. FINDINGS: Right Kidney: Renal measurements: 10.9 x 6.3 x 6.5 cm = volume: 231 mL. Echogenic renal parenchyma, suggesting medical renal disease. 2.9 x 2.5 x 2.0 cm simple upper pole cyst. 5.6 x 5.0 x 4.8 cm lobulated cystic lesion in the right renal pelvis favors a renal sinus cyst given the appearance, although this is not usually distinguished from hydronephrosis on the provided study. Mild associated hydronephrosis is suspected. Left Kidney: Renal measurements: 8.7 x 5.3 x 4.4 cm = volume: 105 mL. Echogenic renal parenchyma, suggesting medical renal disease. 3.0 x 2.6 x 2.5 cm upper pole cysts, poorly visualized. No hydronephrosis. Bladder: Appears normal for degree of bladder distention. Other: None. IMPRESSION: Echogenic renal parenchyma, suggesting medical renal disease. Bilateral renal cysts, including a suspected 5.6 cm right renal sinus cyst, although this is poorly distinguished from hydronephrosis on the provided study. Mild associated right hydronephrosis is suspected. Electronically Signed   By: Charline Bills M.D.   On: 09/28/2019 18:50   Scheduled Meds:  heparin  5,000 Units Subcutaneous Q12H   sodium chloride flush  3 mL Intravenous Q12H   Continuous Infusions:  sodium chloride 75 mL/hr at 09/29/19 0113    LOS: 1 day   Merlene Laughter, DO [facility] PAGER is on AMION  If 7PM-7AM, please contact night-coverage [email or web address]

## 2019-09-29 NOTE — Evaluation (Signed)
Physical Therapy Evaluation Patient Details Name: Rodney Perkins MRN: [id number] DOB: [birth year] Today's Date: 09/29/2019   History of Present Illness  86 y.o. male with medical history significant of stroke, Alzheimer's, vasovagal syncope, who presents to ER for evaluation of one syncopal episode.  Patient was receiving his second Covid vaccine at a clinic and after waiting is 15 minutes when he went to stand up he had a syncopal episode.    Clinical Impression  Pt admitted with above diagnosis. PTA pt lived with his daughter and son in law. He ambulated very short distances with a RW. Assist required for all functional mobility and ADLs. Pt able to feed himself. On eval he required +2 max assist bed mobility, and min assist to maintain sitting balance EOB. Pt very lethargic with minimal eye opening and poor engagement in session. Suspect assist level will decrease closer to baseline as pt becomes more alert/rested.  Pt currently with functional limitations due to the deficits listed below (see PT Problem List). Pt will benefit from skilled PT to increase their independence and safety with mobility to allow discharge to the venue listed below.       Follow Up Recommendations Home health PT;Supervision/Assistance - 24 hour    Equipment Recommendations  Wheelchair cushion (measurements PT);Wheelchair (measurements PT)    Recommendations for Other Services       Precautions / Restrictions Precautions Precautions: Fall      Mobility  Bed Mobility Overal bed mobility: Needs Assistance Bed Mobility: Rolling;Supine to Sit;Sit to Supine Rolling: Max assist   Supine to sit: +2 for physical assistance;Max assist Sit to supine: Max assist;+2 for physical assistance   General bed mobility comments: increased assist due to alertness level. BP supine 113/43 and sitting 97/54.  Transfers                 General transfer comment: unable to progress beyond EOB  Ambulation/Gait                 Stairs            Wheelchair Mobility    Modified Rankin (Stroke Patients Only)       Balance Overall balance assessment: Needs assistance Sitting-balance support: No upper extremity supported;Feet supported Sitting balance-Leahy Scale: Fair Sitting balance - Comments: min assist for sitting balance without UE support                                     Pertinent Vitals/Pain Pain Assessment: Faces Faces Pain Scale: No hurt    Home Living Family/patient expects to be discharged to:: Private residence Living Arrangements: Children Available Help at Discharge: Family;Available 24 hours/day Type of Home: House Home Access: Ramped entrance     Home Layout: One level Home Equipment: Walker - 2 wheels;Shower seat;Other (comment)(gerichair) Additional Comments: currently has a borrowed wheelchair    Prior Function Level of Independence: Needs assistance   Gait / Transfers Assistance Needed: ambulates very short distances with RW, bed to gerichair  ADL's / Homemaking Assistance Needed: incontinent, assist for bathing, dressing and toilet hygiene. Able to feed himself with set up/supervision (chopped foods).  Comments: Pt's wife (who was his caregiver) passed away in 04/16/2019. Pt moved in with his daughter and son in law at that time.     Hand Dominance        Extremity/Trunk Assessment   Upper Extremity Assessment Upper Extremity Assessment: RUE  deficits/detail;Generalized weakness RUE Deficits / Details: residual deficits from previous CVA, resting in flexed position    Lower Extremity Assessment Lower Extremity Assessment: RLE deficits/detail;Generalized weakness RLE Deficits / Details: residual deficits from previous CVA    Cervical / Trunk Assessment Cervical / Trunk Assessment: Kyphotic  Communication   Communication: Expressive difficulties  Cognition Arousal/Alertness: Lethargic Behavior During Therapy: Flat  affect Overall Cognitive Status: History of cognitive impairments - at baseline                                 General Comments: Alzheimer's dementia at baseline. Daughter reports verbalizations fluctuate day to day. Some days pt very quiet and speaking gibberish and other days very conversive. Oriented to self. Daughter reports pt is very extroverted and gets excited when he knows he is getting to go somewhere (i.e. doctor's appt, for a drive).      General Comments General comments (skin integrity, edema, etc.): Mobility limited by lethargy.    Exercises     Assessment/Plan    PT Assessment Patient needs continued PT services  PT Problem List Decreased strength;Decreased mobility;Decreased activity tolerance;Decreased cognition;Decreased balance;Decreased knowledge of use of DME       PT Treatment Interventions DME instruction;Therapeutic activities;Balance training;Functional mobility training;Therapeutic exercise;Patient/family education;Gait training    PT Goals (Current goals can be found in the Care Plan section)  Acute Rehab PT Goals Patient Stated Goal: home per daughter PT Goal Formulation: With patient/family Time For Goal Achievement: 10/13/19 Potential to Achieve Goals: Good    Frequency     Barriers to discharge        Co-evaluation               AM-PAC PT "6 Clicks" Mobility  Outcome Measure Help needed turning from your back to your side while in a flat bed without using bedrails?: A Lot Help needed moving from lying on your back to sitting on the side of a flat bed without using bedrails?: A Lot Help needed moving to and from a bed to a chair (including a wheelchair)?: A Lot Help needed standing up from a chair using your arms (e.g., wheelchair or bedside chair)?: A Lot Help needed to walk in hospital room?: A Lot Help needed climbing 3-5 steps with a railing? : Total 6 Click Score: 11    End of Session   Activity Tolerance:  Patient limited by lethargy Patient left: in bed;with call bell/phone within reach;with bed alarm set;with family/visitor present Nurse Communication: Mobility status PT Visit Diagnosis: Other abnormalities of gait and mobility (R26.89);Muscle weakness (generalized) (M62.81)    Time: 0109-3235 PT Time Calculation (min) (ACUTE ONLY): 34 min   Charges:   PT Evaluation $PT Eval Moderate Complexity: 1 Mod PT Treatments $Therapeutic Activity: 8-22 mins        Aida Raider, PT  Office # [phone] Pager [phone]   Ilda Foil 09/29/2019, 1:16 PM

## 2019-09-30 DIAGNOSIS — R55 Syncope and collapse: Secondary | ICD-10-CM

## 2019-09-30 DIAGNOSIS — N179 Acute kidney failure, unspecified: Principal | ICD-10-CM

## 2019-09-30 LAB — COMPREHENSIVE METABOLIC PANEL
ALT: 10 U/L (ref 0–44)
AST: 14 U/L — ABNORMAL LOW (ref 15–41)
Albumin: 2.6 g/dL — ABNORMAL LOW (ref 3.5–5.0)
Alkaline Phosphatase: 56 U/L (ref 38–126)
Anion gap: 7 (ref 5–15)
BUN: 29 mg/dL — ABNORMAL HIGH (ref 8–23)
CO2: 22 mmol/L (ref 22–32)
Calcium: 8.1 mg/dL — ABNORMAL LOW (ref 8.9–10.3)
Chloride: 113 mmol/L — ABNORMAL HIGH (ref 98–111)
Creatinine, Ser: 2.03 mg/dL — ABNORMAL HIGH (ref 0.61–1.24)
GFR calc Af Amer: 32 mL/min — ABNORMAL LOW (ref >60)
GFR calc non Af Amer: 27 mL/min — ABNORMAL LOW (ref >60)
Glucose, Bld: 105 mg/dL — ABNORMAL HIGH (ref 70–99)
Potassium: 3.4 mmol/L — ABNORMAL LOW (ref 3.5–5.1)
Sodium: 142 mmol/L (ref 135–145)
Total Bilirubin: 0.5 mg/dL (ref 0.3–1.2)
Total Protein: 5.1 g/dL — ABNORMAL LOW (ref 6.5–8.1)

## 2019-09-30 LAB — CBC WITH DIFFERENTIAL/PLATELET
Abs Immature Granulocytes: 0.01 10*3/uL (ref 0.00–0.07)
Basophils Absolute: 0 10*3/uL (ref 0.0–0.1)
Basophils Relative: 0 %
Eosinophils Absolute: 0.2 10*3/uL (ref 0.0–0.5)
Eosinophils Relative: 4 %
HCT: 28.7 % — ABNORMAL LOW (ref 39.0–52.0)
Hemoglobin: 9.1 g/dL — ABNORMAL LOW (ref 13.0–17.0)
Immature Granulocytes: 0 %
Lymphocytes Relative: 28 %
Lymphs Abs: 1.5 10*3/uL (ref 0.7–4.0)
MCH: 31.7 pg (ref 26.0–34.0)
MCHC: 31.7 g/dL (ref 30.0–36.0)
MCV: 100 fL (ref 80.0–100.0)
Monocytes Absolute: 0.5 10*3/uL (ref 0.1–1.0)
Monocytes Relative: 10 %
Neutro Abs: 3.1 10*3/uL (ref 1.7–7.7)
Neutrophils Relative %: 58 %
Platelets: 188 10*3/uL (ref 150–400)
RBC: 2.87 MIL/uL — ABNORMAL LOW (ref 4.22–5.81)
RDW: 12.6 % (ref 11.5–15.5)
WBC: 5.4 10*3/uL (ref 4.0–10.5)
nRBC: 0 % (ref 0.0–0.2)

## 2019-09-30 LAB — FERRITIN: Ferritin: 318 ng/mL (ref 24–336)

## 2019-09-30 LAB — RETICULOCYTES
Immature Retic Fract: 11.8 % (ref 2.3–15.9)
RBC.: 2.85 MIL/uL — ABNORMAL LOW (ref 4.22–5.81)
Retic Count, Absolute: 38.5 10*3/uL (ref 19.0–186.0)
Retic Ct Pct: 1.4 % (ref 0.4–3.1)

## 2019-09-30 LAB — GLUCOSE, CAPILLARY
Glucose-Capillary: 99 mg/dL (ref 70–99)
Glucose-Capillary: 105 mg/dL — ABNORMAL HIGH (ref 70–99)

## 2019-09-30 LAB — IRON AND TIBC
Iron: 26 ug/dL — ABNORMAL LOW (ref 45–182)
Saturation Ratios: 16 % — ABNORMAL LOW (ref 17.9–39.5)
TIBC: 167 ug/dL — ABNORMAL LOW (ref 250–450)
UIBC: 141 ug/dL

## 2019-09-30 LAB — FOLATE: Folate: 22.7 ng/mL (ref >5.9)

## 2019-09-30 LAB — MAGNESIUM: Magnesium: 1.7 mg/dL (ref 1.7–2.4)

## 2019-09-30 LAB — PHOSPHORUS: Phosphorus: 3.3 mg/dL (ref 2.5–4.6)

## 2019-09-30 LAB — VITAMIN B12: Vitamin B-12: >7500 pg/mL — ABNORMAL HIGH (ref 180–914)

## 2019-09-30 MED ORDER — SODIUM CHLORIDE 0.9 % IV SOLN: INTRAVENOUS | Status: DC

## 2019-09-30 NOTE — Plan of Care (Signed)
  Problem: Nutrition: Goal: Adequate nutrition will be maintained Outcome: Progressing   

## 2019-09-30 NOTE — Evaluation (Addendum)
Occupational Therapy Evaluation Patient Details Name: Rodney Perkins MRN: [id number] DOB: Nov 05, 1923 Today's Date: 09/30/2019    History of Present Illness 86 y.o. male with medical history significant of stroke, Alzheimer's, vasovagal syncope, who presents to ER for evaluation of one syncopal episode.  Patient was receiving his second Covid vaccine at a clinic and after waiting is 15 minutes when he went to stand up he had a syncopal episode.   Clinical Impression   This 86 y/o male presents with the above. PLOF obtained via chart review as pt with hx of cognitive impairments and no family/caregiver present. Pt intermittently nodding head yes/no to some questions but otherwise does not communicate with therapists, minimal command following noted. Pt requiring two person assist for safe completion of bed mobility, initially requiring maxA for sitting balance EOB progressed to bouts of min-modA. He currently requires totalA for ADL completion. Deferred mobility beyond EOB as pt with orthostatic BP (see general comments below), pt denies dizziness when asked. He will benefit from continued acute OT services, currently recommend SNF level therapy services unless family able to provide necessary assist at time of discharge given pt's current level. Will follow.     Follow Up Recommendations  SNF;Supervision/Assistance - 24 hour;Home health OT(SNF vs home pending available family assist)    Equipment Recommendations  3 in 1 bedside commode           Precautions / Restrictions Precautions Precautions: Fall;Other (comment) Precaution Comments: watch BP- orthostatic Restrictions Weight Bearing Restrictions: No      Mobility Bed Mobility Overal bed mobility: Needs Assistance Bed Mobility: Supine to Sit;Sit to Supine     Supine to sit: Max assist;+2 for physical assistance Sit to supine: Max assist;+2 for physical assistance   General bed mobility comments: maxAx2 for bed mobility- once  sitting at EOB initially required MaxA for upright, faded to Min-ModA due to posterior lean. Shifting hips L/R at EOB.  Transfers                 General transfer comment: deferred- orthostatic    Balance Overall balance assessment: Needs assistance Sitting-balance support: No upper extremity supported;Feet supported Sitting balance-Leahy Scale: Poor Sitting balance - Comments: Initially MaxA, faded to Min-ModA without UE support Postural control: Posterior lean                                 ADL either performed or assessed with clinical judgement   ADL Overall ADL's : Needs assistance/impaired                                       General ADL Comments: pt currently totalA, attempted hand over hand for face washing task with no initiation to carry over     Vision         Perception     Praxis      Pertinent Vitals/Pain Pain Assessment: Faces Pain Score: 0-No pain Faces Pain Scale: No hurt Pain Intervention(s): Monitored during session     Hand Dominance     Extremity/Trunk Assessment Upper Extremity Assessment Upper Extremity Assessment: RUE deficits/detail;LUE deficits/detail;Difficult to assess due to impaired cognition RUE Deficits / Details: noted contractures/deformities to bil hands, no active movement noted in RUE, rests in flexed position  RUE Coordination: decreased fine motor;decreased gross motor LUE Deficits / Details: noted contractures/deformities to bil  hands, pt will move LUE on his own accord but will not move on command LUE Coordination: decreased gross motor;decreased fine motor   Lower Extremity Assessment Lower Extremity Assessment: Defer to PT evaluation   Cervical / Trunk Assessment Cervical / Trunk Assessment: Kyphotic   Communication Communication Communication: Expressive difficulties   Cognition Arousal/Alertness: Lethargic Behavior During Therapy: Flat affect Overall Cognitive Status: History  of cognitive impairments - at baseline                                 General Comments: Alzheimers dementia at baseline- very non-verbal today, will shake head yes or no if asked simple questions but this was inconsistent today and unable to follow simple cues like smiling or squeezing PT's hand   General Comments  BP supine 127/56 (73), sitting initially 118/100 (107), after sitting approximately 5 minutes 102/60 (69)    Exercises     Shoulder Instructions      Home Living Family/patient expects to be discharged to:: Private residence Living Arrangements: Children Available Help at Discharge: Family;Available 24 hours/day Type of Home: House Home Access: Ramped entrance     Home Layout: One level     Bathroom Shower/Tub: Chief Strategy Officer: Standard     Home Equipment: Environmental consultant - 2 wheels;Shower seat;Other (comment)(gerichair)   Additional Comments: currently has a borrowed wheelchair      Prior Functioning/Environment Level of Independence: Needs assistance  Gait / Transfers Assistance Needed: ambulates very short distances with RW, bed to gerichair ADL's / Homemaking Assistance Needed: incontinent, assist for bathing, dressing and toilet hygiene. Able to feed himself with set up/supervision (chopped foods).   Comments: Pt's wife (who was his caregiver) passed away in 04/18/2019. Pt moved in with his daughter and son in law at that time.        OT Problem List: Decreased strength;Decreased range of motion;Decreased activity tolerance;Impaired balance (sitting and/or standing);Decreased cognition;Decreased safety awareness;Decreased knowledge of use of DME or AE;Impaired UE functional use;Impaired tone      OT Treatment/Interventions: Self-care/ADL training;Therapeutic exercise;DME and/or AE instruction;Therapeutic activities;Visual/perceptual remediation/compensation;Patient/family education;Balance training;Cognitive  remediation/compensation    OT Goals(Current goals can be found in the care plan section) Acute Rehab OT Goals Patient Stated Goal: home per daughter OT Goal Formulation: With family Time For Goal Achievement: 10/14/19 Potential to Achieve Goals: Fair  OT Frequency: Min 2X/week   Barriers to D/C:            Co-evaluation PT/OT/SLP Co-Evaluation/Treatment: Yes Reason for Co-Treatment: For patient/therapist safety;To address functional/ADL transfers;Necessary to address cognition/behavior during functional activity   OT goals addressed during session: ADL's and self-care      AM-PAC OT "6 Clicks" Daily Activity     Outcome Measure Help from another person eating meals?: Total Help from another person taking care of personal grooming?: Total Help from another person toileting, which includes using toliet, bedpan, or urinal?: Total Help from another person bathing (including washing, rinsing, drying)?: Total Help from another person to put on and taking off regular upper body clothing?: Total Help from another person to put on and taking off regular lower body clothing?: Total 6 Click Score: 6   End of Session Nurse Communication: Mobility status  Activity Tolerance: Patient tolerated treatment well Patient left: in bed;with call bell/phone within reach;with bed alarm set  OT Visit Diagnosis: Muscle weakness (generalized) (M62.81);Other symptoms and signs involving cognitive function  Time: 1105-1130 OT Time Calculation (min): 25 min Charges:  OT General Charges $OT Visit: 1 Visit OT Evaluation $OT Eval Moderate Complexity: 1 Mod  Marcy Siren, OT [employer] Pager [phone] Office [phone]   Orlando Penner 09/30/2019, 12:33 PM

## 2019-09-30 NOTE — Evaluation (Signed)
Clinical/Bedside Swallow Evaluation Patient Details  Name: Rodney Perkins MRN: [id number] Date of Birth: December 03, 1923  Today's Date: 09/30/2019 Time: SLP Start Time (ACUTE ONLY): 2778 SLP Stop Time (ACUTE ONLY): 0947 SLP Time Calculation (min) (ACUTE ONLY): 22 min  Past Medical History:  Past Medical History:  Diagnosis Date  . Alzheimer's dementia ([place])   . Stroke ([place])   . Vagal reaction    Past Surgical History: The histories are not reviewed yet. Please review them in the "History" navigator section and refresh this [place]. HPI:  86 y.o. male with medical history significant of stroke, advanced Alzheimer's, vasovagal syncope, who presents to ER for evaluation of one syncopal episode after second covid vaccine. CXR = lungs clear. HeadCT = No acute intracranial abnormality. 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   Assessment / Plan / Recommendation Clinical Impression  Pt was sleeping upon arrival of SLP, but awakened with verbal and tactile stim. Pt was largely nonvocal, and was unable to follow verbal commands. Pt presents without teeth. Upper and lower dentures were cleaned, and attempted to be placed, however, pt tried to bite down on them and did not accept them into his mouth. Pt accepted trials of thin liquid and puree without immediate overt s/s aspiration. Pt was unable to demonstrate ability to bite solid texture, sucking on the graham cracker like a straw instead. Cough x2 noted several minutes after termination of po trials. Pt was left in upright position. Dentures in cup at sink. Will change diet to puree/thin liquids based on presentation at bedside. SLP will follow briefly for diet tolerance and education, and to determine if advanced solids are appropriate. Safe swallow precautions posted at [facility].   SLP Visit Diagnosis: Dysphagia, unspecified (R13.10)    Aspiration Risk  Mild aspiration risk;Risk for inadequate  nutrition/hydration    Diet Recommendation Dysphagia 1 (Puree);Thin liquid   Liquid Administration via: Straw Medication Administration: Crushed with puree Supervision: Staff to assist with self feeding;Full supervision/cueing for compensatory strategies Compensations: Minimize environmental distractions;Small sips/bites;Slow rate Postural Changes: Seated upright at 90 degrees;Remain upright for at least 30 minutes after po intake    Other  Recommendations Oral Care Recommendations: Oral care BID   Follow up Recommendations 24 hour supervision/assistance      Frequency and Duration min 1 x/week  2 weeks;1 week       Prognosis Prognosis for Safe Diet Advancement: Fair Barriers to Reach Goals: Cognitive deficits      Swallow Study   General Date of Onset: 09/28/19 HPI: 86 y.o. male with medical history significant of stroke, advanced Alzheimer's, vasovagal syncope, who presents to ER for evaluation of one syncopal episode after second covid vaccine. CXR = lungs clear. HeadCT = No acute intracranial abnormality. Old left posterior cerebral artery territory infarct and severegeneralized atrophy. Type of Study: Bedside Swallow Evaluation Previous Swallow Assessment: none found Diet Prior to this Study: Regular;Thin liquids Temperature Spikes Noted: No Respiratory Status: Room air History of Recent Intubation: No Behavior/Cognition: Doesn't follow directions;Requires cueing;Uncooperative;Alert Oral Cavity Assessment: Within Functional Limits Oral Care Completed by SLP: Yes Oral Cavity - Dentition: Edentulous(pt would not allow dentures to be placed) Self-Feeding Abilities: Total assist Patient Positioning: Upright in bed Baseline Vocal Quality: Normal Volitional Cough: Cognitively unable to elicit Volitional Swallow: Unable to elicit    Oral/Motor/Sensory Function Overall Oral Motor/Sensory Function: (unable to assess)   Ice Chips Ice chips: Not tested   Thin Liquid Thin  Liquid: Within functional limits Presentation: Straw  Nectar Thick Nectar Thick Liquid: Not tested   Honey Thick Honey Thick Liquid: Not tested   Puree Puree: Within functional limits Presentation: Spoon Other Comments: only very small boluses accepted   Solid     Solid: Impaired Other Comments: pt sucked on cracker - would not bite or demonstrate ability to chew     Annalena Piatt B. Murvin Natal, [facility], CCC-SLP Speech Language Pathologist Office: [phone] Pager: [phone]   [place]and[place], Nill 09/30/2019,9:47 AM

## 2019-09-30 NOTE — Progress Notes (Signed)
PROGRESS NOTE  Rodney Perkins  DOB: 1924-03-25  PCP: Patient, No Pcp Per ZGY:[id number]  DOA: 09/28/2019 Admitted From: Home  LOS: 2 days   Chief Complaint  Patient presents with  . Loss of Consciousness   Brief narrative: Rodney Perkins a 86 y.o.malewith medical history significant ofstroke, Alzheimer's, vasovagal syncope. He was brought to the ED on 2/15 after a syncopal episode.  Apparently he was waiting in line to get the Covid vaccine.  When he returned came, he stood up followed by a syncopal episode.  He was down for 15 minutes before his consciousness return.  At the time of EMS arrival, patient was alert and responsive.  Patient ambulates similar episode a month ago.  Family also reported that patient had not been sleeping well for 2 nights, and on the morning of admission, patient had two large loose diarrhea which filled up his diaper.  In the ED, patient was tachycardic.  Afebrile. Initial blood work showed an elevated sodium level 174, creatinine 2.31. CT showed "No acute intracranial abnormality.  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  Subjective: Patient was seen and examined this morning.  Elderly Caucasian male.  Lying down in bed.  Opens eyes on touch.  Unable to have a conversation.  Assessment/Plan: Syncope -Likely vasovagal from Dehydration -He has been hypokalemic from sudden onset of diarrhea and had significant loose stools -Currently on D5 water infusion.  Switch to normal saline at 100 mL/h. -UA showed moderate hemoglobin, trace leukocytes, rare bacteria, 6-10 WBCs and blood cultures and urine culture still pending -ECHO showed EF of 70-75% with G1DD -PT/OT evaluation recommended SNF.  AKI on CKD stage III -Most recent Cr 1.6 about two years ago -Creatinine on admission was elevated to 2.31. -Renal  ultrasound showed medical renal disease. -With IV hydration, creatinine level is improving, 2.03 today. -Repeat blood work in the morning.  Acute hypernatremia -Sodium level was significantly elevated to 174 on admission.  However repeat sodium level in 2 hours reported sodium level at 146.  I think the initial reading probably was 147 reported erroneously as 174.   Fever -One episode of fever of 101.4 on 2/16.  No signs recurrence.  WBC count normal.  Blood culture has not shown any growth so far.  CT sample was ordered but patient is having formed stool.  No need to obtain C. difficile.   -Continue to monitor temperature and WBC count.  History of CVA with right sided weakness -C/w PT/OT Evaluation.  Dementia -Seems to be advanced dementia.  Continue supportive care.  DVT prophylaxis: Heparin 5,000 unit sq q8h Code Status: FULL CODE  Family Communication:  Will call family later today. Disposition Plan:  Continue fluid.  If creatinine continues to improve, may be medically ready for discharge tomorrow.  SNF versus home health to be discussed with family.   Consultants:   None  Procedures:   ECHOCARDIOGRAM  Antimicrobials: Anti-infectives (From admission, onward)   None        Code Status: Full Code   Diet Order            DIET - DYS 1 Room service appropriate? No; Fluid consistency: Thin  Diet effective now              Infusions:  . sodium chloride 100 mL/hr at 09/30/19 0901    Scheduled Meds: . heparin  5,000 Units Subcutaneous Q12H  .  sodium chloride flush  3 mL Intravenous Q12H    PRN meds: acetaminophen **OR** acetaminophen   Objective: Vitals:   09/30/19 0000 09/30/19 0444  BP:  (!) 116/49  Pulse:  77  Resp:  18  Temp: 99.9 F (37.7 C) 99.6 F (37.6 C)  SpO2:  99%    Intake/Output Summary (Last 24 hours) at 09/30/2019 1153 Last data filed at 09/30/2019 0901 Gross per 24 hour  Intake 2026.8 ml  Output 700 ml  Net 1326.8 ml   There  were no vitals filed for this visit. Weight change:  There is no height or weight on file to calculate BMI.   Physical Exam: General exam: Appears calm and comfortable.  Lying on bed not in distress. Skin: No rashes, lesions or ulcers. HEENT: Atraumatic, normocephalic, supple neck, no obvious bleeding Lungs: Clear to auscultation bilaterally CVS: Regular rate and rhythm, no murmur GI/Abd soft, nondistended, nontender, bowel sound present CNS: Opens eyes to touch.  Unable to have a conversation.   Psychiatry: Depressed look Extremities: No pedal edema, no calf tenderness  Data Review: I have personally reviewed the laboratory data and studies available.  Recent Labs  Lab 09/28/19 1337 09/29/19 0841 09/30/19 0538  WBC 12.0* 7.0 5.4  NEUTROABS 10.4* 4.4 3.1  HGB 11.2* 9.5* 9.1*  HCT 34.9* 29.6* 28.7*  MCV 97.8 99.3 100.0  PLT 251 206 188   Recent Labs  Lab 09/28/19 1122 09/28/19 1335 09/29/19 0525 09/29/19 0841 09/30/19 0538  NA 174* 146* 146*  --  142  K 4.8 4.7 3.7  --  3.4*  CL >130* 112* 114*  --  113*  CO2 26 21* 23  --  22  GLUCOSE 126* 111* 96  --  105*  BUN 40* 41* 33*  --  29*  CREATININE 2.31* 2.39* 2.19*  --  2.03*  CALCIUM 9.2 8.9 8.4*  --  8.1*  MG  --   --   --  1.9 1.7  PHOS  --   --   --  3.8 3.3    Lorin Glass, MD  [facility] 09/30/2019

## 2019-09-30 NOTE — Progress Notes (Signed)
Physical Therapy Treatment Patient Details Name: Rodney Perkins MRN: [id number] DOB: 02-20-1924 Today's Date: 09/30/2019    History of Present Illness 86 y.o. male with medical history significant of stroke, Alzheimer's, vasovagal syncope, who presents to ER for evaluation of one syncopal episode.  Patient was receiving his second Covid vaccine at a clinic and after waiting is 15 minutes when he went to stand up he had a syncopal episode.    PT Comments    Patient received in bed, still somewhat lethargic but much less so than at evaluation- eyes were open, he was mostly non-verbal but did shake head yes or no to simple questions inconsistently. Required MaxAx2 for functional bed mobility, limited by orthostatics sitting at EOB (BP supine 127/56 (73), sitting initially 118/100 (107), after sitting approximately 5 minutes 102/60 (69). Unable to follow simple cues sitting at EOB today. Returned to supine with maxAx2 and left in bed positioned to comfort with all needs met, bed alarm active. Updated recommendation to SNF due to ongoing increased need for +2 assist even with improved alertness today.     Follow Up Recommendations  SNF;Supervision/Assistance - 24 hour     Equipment Recommendations  Wheelchair cushion (measurements PT);Wheelchair (measurements PT)    Recommendations for Other Services       Precautions / Restrictions Precautions Precautions: Fall;Other (comment) Precaution Comments: watch BP- orthostatic Restrictions Weight Bearing Restrictions: No    Mobility  Bed Mobility Overal bed mobility: Needs Assistance Bed Mobility: Supine to Sit;Sit to Supine     Supine to sit: Max assist;+2 for physical assistance Sit to supine: Max assist;+2 for physical assistance   General bed mobility comments: maxAx2 for bed mobility- once sitting at EOB initially required MaxA for upright, faded to Min-ModA due to posterior lean. Shifting hips L/R at EOB.  Transfers                  General transfer comment: deferred- orthostatic  Ambulation/Gait             General Gait Details: deferred- orthostatic   Stairs             Wheelchair Mobility    Modified Rankin (Stroke Patients Only)       Balance Overall balance assessment: Needs assistance Sitting-balance support: No upper extremity supported;Feet supported Sitting balance-Leahy Scale: Poor Sitting balance - Comments: Initially MaxA, faded to Min-ModA without UE support Postural control: Posterior lean                                  Cognition Arousal/Alertness: Lethargic Behavior During Therapy: Flat affect Overall Cognitive Status: History of cognitive impairments - at baseline                                 General Comments: Alzheimers dementia at baseline- very non-verbal today, will shake head yes or no if asked simple questions but this was inconsistent today and unable to follow simple cues like smiling or squeezing PT's hand      Exercises      General Comments General comments (skin integrity, edema, etc.): BP supine 127/56 (73), sitting initially 118/100 (107), after sitting approximately 5 minutes 102/60 (69)      Pertinent Vitals/Pain Pain Assessment: Faces Pain Score: 0-No pain Faces Pain Scale: No hurt Pain Intervention(s): Limited activity within patient's tolerance;Monitored during session  Home Living                      Prior Function            PT Goals (current goals can now be found in the care plan section) Acute Rehab PT Goals Patient Stated Goal: home per daughter PT Goal Formulation: With patient/family Time For Goal Achievement: 10/13/19 Potential to Achieve Goals: Good Progress towards PT goals: Not progressing toward goals - comment(limited by lethargy and orthostatics)    Frequency    Min 2X/week      PT Plan Discharge plan needs to be updated    Co-evaluation               AM-PAC PT "6 Clicks" Mobility   Outcome Measure  Help needed turning from your back to your side while in a flat bed without using bedrails?: A Lot Help needed moving from lying on your back to sitting on the side of a flat bed without using bedrails?: Total Help needed moving to and from a bed to a chair (including a wheelchair)?: Total Help needed standing up from a chair using your arms (e.g., wheelchair or bedside chair)?: Total Help needed to walk in hospital room?: Total Help needed climbing 3-5 steps with a railing? : Total 6 Click Score: 7    End of Session   Activity Tolerance: Other (comment)(orthostatics) Patient left: in bed;with bed alarm set;with call bell/phone within reach   PT Visit Diagnosis: Muscle weakness (generalized) (M62.81)     Time: 1105-1130 PT Time Calculation (min) (ACUTE ONLY): 25 min  Charges:  $Therapeutic Activity: 8-22 mins(co-tx with OT)                     Windell Norfolk, DPT, PN1   Supplemental Physical Therapist [place]    Pager [phone] Acute Rehab Office [phone]

## 2019-10-01 LAB — CBC WITH DIFFERENTIAL/PLATELET
Abs Immature Granulocytes: 0.01 10*3/uL (ref 0.00–0.07)
Basophils Absolute: 0 10*3/uL (ref 0.0–0.1)
Basophils Relative: 0 %
Eosinophils Absolute: 0.3 10*3/uL (ref 0.0–0.5)
Eosinophils Relative: 6 %
HCT: 27.6 % — ABNORMAL LOW (ref 39.0–52.0)
Hemoglobin: 9 g/dL — ABNORMAL LOW (ref 13.0–17.0)
Immature Granulocytes: 0 %
Lymphocytes Relative: 35 %
Lymphs Abs: 1.6 10*3/uL (ref 0.7–4.0)
MCH: 32 pg (ref 26.0–34.0)
MCHC: 32.6 g/dL (ref 30.0–36.0)
MCV: 98.2 fL (ref 80.0–100.0)
Monocytes Absolute: 0.6 10*3/uL (ref 0.1–1.0)
Monocytes Relative: 12 %
Neutro Abs: 2.2 10*3/uL (ref 1.7–7.7)
Neutrophils Relative %: 47 %
Platelets: 183 10*3/uL (ref 150–400)
RBC: 2.81 MIL/uL — ABNORMAL LOW (ref 4.22–5.81)
RDW: 12.8 % (ref 11.5–15.5)
WBC: 4.7 10*3/uL (ref 4.0–10.5)
nRBC: 0 % (ref 0.0–0.2)

## 2019-10-01 LAB — BASIC METABOLIC PANEL
Anion gap: 9 (ref 5–15)
BUN: 26 mg/dL — ABNORMAL HIGH (ref 8–23)
CO2: 21 mmol/L — ABNORMAL LOW (ref 22–32)
Calcium: 8.1 mg/dL — ABNORMAL LOW (ref 8.9–10.3)
Chloride: 113 mmol/L — ABNORMAL HIGH (ref 98–111)
Creatinine, Ser: 1.79 mg/dL — ABNORMAL HIGH (ref 0.61–1.24)
GFR calc Af Amer: 37 mL/min — ABNORMAL LOW (ref >60)
GFR calc non Af Amer: 32 mL/min — ABNORMAL LOW (ref >60)
Glucose, Bld: 101 mg/dL — ABNORMAL HIGH (ref 70–99)
Potassium: 3.7 mmol/L (ref 3.5–5.1)
Sodium: 143 mmol/L (ref 135–145)

## 2019-10-01 LAB — URINE CULTURE: Culture: 70000 — AB

## 2019-10-01 LAB — GLUCOSE, CAPILLARY
Glucose-Capillary: 84 mg/dL (ref 70–99)
Glucose-Capillary: 84 mg/dL (ref 70–99)

## 2019-10-01 MED ORDER — CEPHALEXIN 500 MG PO CAPS: 500.0000 mg | ORAL_CAPSULE | Freq: Two times a day (BID) | ORAL | Status: DC

## 2019-10-01 MED ORDER — AMOXICILLIN 500 MG PO CAPS
500.0000 mg | ORAL_CAPSULE | Freq: Two times a day (BID) | ORAL | Status: DC
  Administered 2019-10-01 – 2019-10-02 (×3): 500 mg via ORAL
  Filled 2019-10-01 (×4): qty 1
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
## 2019-10-01 NOTE — Plan of Care (Signed)
  Problem: Nutrition: Goal: Adequate nutrition will be maintained Outcome: Progressing   

## 2019-10-01 NOTE — Progress Notes (Addendum)
PROGRESS NOTE  Rodney Perkins  DOB: April 02, 1924  PCP: Patient, No Pcp Per [facility]:[id number]  DOA: 09/28/2019 Admitted From: Home  LOS: 3 days   Chief Complaint  Patient presents with  . Loss of Consciousness   Brief narrative: Crews Mccollam a 86 y.o.malewith medical history significant ofstroke, Alzheimer's, vasovagal syncope. He was brought to the ED on 2/15 after a syncopal episode.  Apparently he was waiting in line to get the Covid vaccine.  When he returned came, he stood up followed by a syncopal episode.  He was down for 15 minutes before his consciousness return.  At the time of EMS arrival, patient was alert and responsive.  Patient ambulates similar episode a month ago.  Family also reported that patient had not been sleeping well for 2 nights, and on the morning of admission, patient had two large loose diarrhea which filled up his diaper.  In the ED, patient was tachycardic.  Afebrile. Initial blood work showed an elevated sodium level 174, creatinine 2.31. CT showed "No acute intracranial abnormality.  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  Subjective: Patient was seen and examined this morning.  He is an elderly male with dementia.  Cheerful.  Unable to answer questions but not in distress.   Assessment/Plan: Syncope -Likely vasovagal from dehydration -He had been hypokalemic from sudden onset of diarrhea and had significant loose stools -He was kept on IV hydration.  -ECHO showed EF of 70-75% with G1DD -PT/OT evaluation obtained.  Family wants to take patient home.  UTI with Proteus mirabilis -UA showed moderate hemoglobin, trace leukocytes, rare bacteria, 6-10 WBCs and blood cultures and urine culture still pending. -Urine culture grew more than 1000 CFU per mL of Proteus mirabilis and 70,000 CFU per mL of Enterococcus  faecalis. -UTI could have been the cause of his altered mentation and syncope. -Patient also had one episode of fever of 101.4 on 2/16.  No recurrence of fever since then. -I will start him on oral Keflex based on sensitivity pattern.  AKI on CKD stage IIIb -Most recent Cr 1.6 about two years ago -Creatinine on admission was elevated to 2.31. -Renal ultrasound showed medical renal disease. -With IV hydration, creatinine level is improving, 1.79 today, still above baseline -Repeat blood work in the morning.  Acute hypernatremia -Sodium level was significantly elevated to 174 on admission.  However repeat sodium level in 2 hours reported sodium level at 146.  I think the initial reading probably was 147 reported erroneously as 174.  -Sodium level has subsequently improved, 143 this morning.  History of CVA with right sided weakness -PT/OT eval obtained.  Dementia -Seems to be advanced dementia.  Continue supportive care.  DVT prophylaxis:  Heparin subcu Antimicrobials:  Oral Keflex Fluid: Normal saline 100 mill per hour.  Reduce to 50 mill per hour. Diet: Dysphagia 1 diet  Code Status:  Full code Mobility: PT/OT eval obtained.  Family prefers to take patient home Family Communication:  I called and updated patient's daughter Ms. [name] this morning.  She does not want patient to be at SNF and is agreeable to take home with home health, PT, OT. Discharge plan:  Anticipated date: Tomorrow Disposition: Home with home health, PT, OT, DME Barriers: Creatinine noted back to baseline  Consultants:  None  Procedures:   ECHOCARDIOGRAM  Antimicrobials: Anti-infectives (From admission, onward)   Start     Dose/Rate Route  Frequency Ordered Stop   10/01/19 1245  cephALEXin (KEFLEX) capsule 500 mg     500 mg Oral Every 12 hours 10/01/19 1234          Code Status: Full Code   Diet Order            DIET - DYS 1 Room service appropriate? No; Fluid consistency: Thin  Diet  effective now              Infusions:  . sodium chloride 100 mL/hr at 10/01/19 1146    Scheduled Meds: . cephALEXin  500 mg Oral Q12H  . heparin  5,000 Units Subcutaneous Q12H  . sodium chloride flush  3 mL Intravenous Q12H    PRN meds: acetaminophen **OR** acetaminophen   Objective: Vitals:   10/01/19 0322 10/01/19 0849  BP: (!) 156/53 (!) 148/58  Pulse: 80 78  Resp: 17 18  Temp: 98.7 F (37.1 C) 98.2 F (36.8 C)  SpO2: 99% 100%    Intake/Output Summary (Last 24 hours) at 10/01/2019 1238 Last data filed at 10/01/2019 1000 Gross per 24 hour  Intake 1820.82 ml  Output 1550 ml  Net 270.82 ml   There were no vitals filed for this visit. Weight change:  There is no height or weight on file to calculate BMI.   Physical Exam: General exam: Appears calm and comfortable.  Lying on bed not in distress.  Cheerful Skin: No rashes, lesions or ulcers. HEENT: Atraumatic, normocephalic, supple neck, no obvious bleeding Lungs: Clear to auscultation bilaterally CVS: Regular rate and rhythm, no murmur GI/Abd soft, nondistended, nontender, bowel sound present CNS: Opens eyes to touch.  Able to follow some motor command.  Unable to have a conversation Psychiatry: Cheerfully demented Extremities: No pedal edema, no calf tenderness  Data Review: I have personally reviewed the laboratory data and studies available.  Recent Labs  Lab 09/28/19 1337 09/29/19 0841 09/30/19 0538 10/01/19 0352  WBC 12.0* 7.0 5.4 4.7  NEUTROABS 10.4* 4.4 3.1 2.2  HGB 11.2* 9.5* 9.1* 9.0*  HCT 34.9* 29.6* 28.7* 27.6*  MCV 97.8 99.3 100.0 98.2  PLT 251 206 188 183   Recent Labs  Lab 09/28/19 1122 09/28/19 1335 09/29/19 0525 09/29/19 0841 09/30/19 0538 10/01/19 0352  NA 174* 146* 146*  --  142 143  K 4.8 4.7 3.7  --  3.4* 3.7  CL >130* 112* 114*  --  113* 113*  CO2 26 21* 23  --  22 21*  GLUCOSE 126* 111* 96  --  105* 101*  BUN 40* 41* 33*  --  29* 26*  CREATININE 2.31* 2.39* 2.19*  --   2.03* 1.79*  CALCIUM 9.2 8.9 8.4*  --  8.1* 8.1*  MG  --   --   --  1.9 1.7  --   PHOS  --   --   --  3.8 3.3  --     Terrilee Croak, MD  [facility] 10/01/2019

## 2019-10-01 NOTE — Progress Notes (Signed)
    Durable Medical Equipment  (From admission, onward)         Start     Ordered   10/01/19 0000  For home use only DME lightweight manual wheelchair with seat cushion    Comments: Patient suffers from dementia, impaired mobility which impairs their ability to perform daily activities like bathing, dressing, feeding and grooming in the home.  A walker will not resolve  issue with performing activities of daily living. A wheelchair will allow patient to safely perform daily activities. Patient is not able to propel themselves in the home using a standard weight wheelchair due to arm weakness. Patient can self propel in the lightweight wheelchair. Length of need Lifetime. Accessories: elevating leg rests (ELRs), wheel locks, extensions and anti-tippers.   10/01/19 1310         Hortencia Conradi, RN MSN CCM Transitions of Care [phone]

## 2019-10-02 LAB — BASIC METABOLIC PANEL
Anion gap: 10 (ref 5–15)
BUN: 26 mg/dL — ABNORMAL HIGH (ref 8–23)
CO2: 23 mmol/L (ref 22–32)
Calcium: 8.4 mg/dL — ABNORMAL LOW (ref 8.9–10.3)
Chloride: 114 mmol/L — ABNORMAL HIGH (ref 98–111)
Creatinine, Ser: 1.7 mg/dL — ABNORMAL HIGH (ref 0.61–1.24)
GFR calc Af Amer: 39 mL/min — ABNORMAL LOW (ref >60)
GFR calc non Af Amer: 34 mL/min — ABNORMAL LOW (ref >60)
Glucose, Bld: 94 mg/dL (ref 70–99)
Potassium: 3.8 mmol/L (ref 3.5–5.1)
Sodium: 147 mmol/L — ABNORMAL HIGH (ref 135–145)

## 2019-10-02 LAB — CBC WITH DIFFERENTIAL/PLATELET
Abs Immature Granulocytes: 0.02 10*3/uL (ref 0.00–0.07)
Basophils Absolute: 0 10*3/uL (ref 0.0–0.1)
Basophils Relative: 0 %
Eosinophils Absolute: 0.3 10*3/uL (ref 0.0–0.5)
Eosinophils Relative: 4 %
HCT: 31.9 % — ABNORMAL LOW (ref 39.0–52.0)
Hemoglobin: 10.4 g/dL — ABNORMAL LOW (ref 13.0–17.0)
Immature Granulocytes: 0 %
Lymphocytes Relative: 28 %
Lymphs Abs: 2.2 10*3/uL (ref 0.7–4.0)
MCH: 31.9 pg (ref 26.0–34.0)
MCHC: 32.6 g/dL (ref 30.0–36.0)
MCV: 97.9 fL (ref 80.0–100.0)
Monocytes Absolute: 0.8 10*3/uL (ref 0.1–1.0)
Monocytes Relative: 10 %
Neutro Abs: 4.6 10*3/uL (ref 1.7–7.7)
Neutrophils Relative %: 58 %
Platelets: 218 10*3/uL (ref 150–400)
RBC: 3.26 MIL/uL — ABNORMAL LOW (ref 4.22–5.81)
RDW: 12.6 % (ref 11.5–15.5)
WBC: 7.9 10*3/uL (ref 4.0–10.5)
nRBC: 0 % (ref 0.0–0.2)

## 2019-10-02 LAB — GLUCOSE, CAPILLARY: Glucose-Capillary: 79 mg/dL (ref 70–99)

## 2019-10-02 MED ORDER — SACCHAROMYCES BOULARDII 250 MG PO CAPS: 250.0000 mg | ORAL_CAPSULE | Freq: Two times a day (BID) | ORAL | 0 refills | Status: AC

## 2019-10-02 MED ORDER — AMOXICILLIN 500 MG PO CAPS: 500.0000 mg | ORAL_CAPSULE | Freq: Two times a day (BID) | ORAL | 0 refills | Status: AC

## 2019-10-02 NOTE — Progress Notes (Signed)
Physical Therapy Treatment Patient Details Name: Rodney Perkins MRN: [id number] DOB: 06-16-1924 Today's Date: 10/02/2019    History of Present Illness 86 y.o. male with medical history significant of stroke, Alzheimer's, vasovagal syncope, who presents to ER for evaluation of one syncopal episode.  Patient was receiving his second Covid vaccine at a clinic and after waiting is 15 minutes when he went to stand up he had a syncopal episode.    PT Comments    Pt was seen for initial mobility to side of bed with better sitting balance control.  Has a limited ability to control midline posture, and after stretching to prop on LUE on pillow in sitting could control sit balance better.  Follow up with him if dc is not completed today to home but daughter had arrived when PT was finished with him.   Follow Up Recommendations  SNF;Supervision/Assistance - 24 hour     Equipment Recommendations  Wheelchair cushion (measurements PT);Wheelchair (measurements PT)    Recommendations for Other Services       Precautions / Restrictions Precautions Precautions: Fall Precaution Comments: orthostatic Restrictions Weight Bearing Restrictions: No    Mobility  Bed Mobility Overal bed mobility: Needs Assistance Bed Mobility: Supine to Sit;Sit to Supine Rolling: Max assist   Supine to sit: Max assist Sit to supine: Max assist      Transfers                    Ambulation/Gait                 Stairs             Wheelchair Mobility    Modified Rankin (Stroke Patients Only)       Balance Overall balance assessment: Needs assistance Sitting-balance support: Feet supported;Single extremity supported Sitting balance-Leahy Scale: Poor(fair once set for a time)   Postural control: Posterior lean                                  Cognition Arousal/Alertness: Awake/alert Behavior During Therapy: Flat affect Overall Cognitive Status: History of cognitive  impairments - at baseline                                 General Comments: Alzheimers dementia at baseline- very non-verbal today, will shake head yes or no if asked simple questions but this was inconsistent today and unable to follow simple cues like smiling or squeezing PT's hand      Exercises General Exercises - Lower Extremity Ankle Circles/Pumps: PROM;Right Hip ABduction/ADduction: PROM;Right    General Comments        Pertinent Vitals/Pain Pain Assessment: Faces Pain Score: 0-No pain    Home Living                      Prior Function            PT Goals (current goals can now be found in the care plan section) Acute Rehab PT Goals Patient Stated Goal: home per daughter Progress towards PT goals: Progressing toward goals    Frequency    Min 2X/week      PT Plan Current plan remains appropriate    Co-evaluation              AM-PAC PT "6 Clicks" Mobility   Outcome Measure  Help  needed turning from your back to your side while in a flat bed without using bedrails?: A Lot Help needed moving from lying on your back to sitting on the side of a flat bed without using bedrails?: A Lot Help needed moving to and from a bed to a chair (including a wheelchair)?: Total Help needed standing up from a chair using your arms (e.g., wheelchair or bedside chair)?: Total Help needed to walk in hospital room?: Total Help needed climbing 3-5 steps with a railing? : Total 6 Click Score: 8    End of Session   Activity Tolerance: Patient tolerated treatment well;Patient limited by fatigue Patient left: in bed;with call bell/phone within reach;with bed alarm set Nurse Communication: Mobility status PT Visit Diagnosis: Muscle weakness (generalized) (M62.81)     Time: 1761-6073 PT Time Calculation (min) (ACUTE ONLY): 19 min  Charges:  $Therapeutic Activity: 8-22 mins                    Ivar Drape 10/02/2019, 12:39 PM   Samul Dada, PT  MS Acute Rehab Dept. Number: [facility] [phone] and [facility] [phone]

## 2019-10-02 NOTE — Discharge Instructions (Signed)
     Syncope Syncope is when you pass out (faint) for a short time. It is caused by a sudden decrease in blood flow to the brain. Signs that you may be about to pass out include:  Feeling dizzy or light-headed.  Feeling sick to your stomach (nauseous).  Seeing all white or all black.  Having cold, clammy skin. If you pass out, get help right away. Call your local emergency services (911 in the [place] Do not drive yourself to the hospital. Follow these instructions at home: Watch for any changes in your symptoms. Take these actions to stay safe and help with your symptoms: Lifestyle  Do not drive, use machinery, or play sports until your doctor says it is okay.  Do not drink alcohol.  Do not use any products that contain nicotine or tobacco, such as cigarettes and e-cigarettes. If you need help quitting, ask your doctor.  Drink enough fluid to keep your pee (urine) pale yellow. General instructions  Take over-the-counter and prescription medicines only as told by your doctor.  If you are taking blood pressure or heart medicine, sit up and stand up slowly. Spend a few minutes getting ready to sit and then stand. This can help you feel less dizzy.  Have someone stay with you until you feel stable.  If you start to feel like you might pass out, lie down right away and raise (elevate) your feet above the level of your heart. Breathe deeply and steadily. Wait until all of the symptoms are gone.  Keep all follow-up visits as told by your doctor. This is important. Get help right away if:  You have a very bad headache.  You pass out once or more than once.  You have pain in your chest, belly, or back.  You have a very fast or uneven heartbeat (palpitations).  It hurts to breathe.  You are bleeding from your mouth or your bottom (rectum).  You have black or tarry poop (stool).  You have jerky movements that you cannot control (seizure).  You are confused.  You have  trouble walking.  You are very weak.  You have vision problems. These symptoms may be an emergency. Do not wait to see if the symptoms will go away. Get medical help right away. Call your local emergency services (911 in the [place] Do not drive yourself to the hospital. Summary  Syncope is when you pass out (faint) for a short time. It is caused by a sudden decrease in blood flow to the brain.  Signs that you may be about to faint include feeling dizzy, light-headed, or sick to your stomach, seeing all white or all black, or having cold, clammy skin.  If you start to feel like you might pass out, lie down right away and raise (elevate) your feet above the level of your heart. Breathe deeply and steadily. Wait until all of the symptoms are gone. This information is not intended to replace advice given to you by your health care provider. Make sure you discuss any questions you have with your health care provider. Document Revised: 09/11/2017 Document Reviewed: 09/11/2017 Elsevier Patient Education  2020 [employer]  

## 2019-10-02 NOTE — Discharge Summary (Signed)
Physician Discharge Summary  Rodney Perkins NUU:[id number] DOB: June 13, 1924 DOA: 09/28/2019  PCP: Patient, No Pcp Per  Admit date: 09/28/2019 Discharge date: 10/02/2019  Admitted From: Home Discharge disposition: Home health, PT, OT, SW   Code Status: Full Code  Diet Recommendation: General   Recommendations for Outpatient Follow-Up:   1. Follow-up with PCP as an outpatient  Discharge Diagnosis:   Active Problems:   Syncope, vasovagal   AKI (acute kidney injury) (HCC)   Syncope  History of Present Illness / Brief narrative:  Rodney Perkins a 86 y.o.malewith medical history significant ofstroke, Alzheimer's, vasovagal syncope. He was brought to the ED on 2/15 after a syncopal episode.  Apparently he was waiting in line to get the Covid vaccine.  When he returned home, he stood up followed by a syncopal episode.  He was down for 15 minutes before his consciousness returned.  At the time of EMS arrival, patient was alert and responsive. Patient had similar episode a month ago.  Family also reported that patient had not been sleeping well for 2 nights, and on the morning of admission, patient had two large loose diarrhea which filled up his diaper.  In the ED, patient was tachycardic.  Afebrile. Initial blood work showed an elevated sodium level 174, creatinine 2.31. CT showed "No acute intracranial abnormality. 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  Hospital Course:  Syncope -Likely vasovagalfrom dehydration -He had been hypokalemicfrom sudden onset of diarrhea and had significant loose stools -Adequately hydrated with IV fluid. -ECHO showed EF of 70-75% with G1DD -PT/OT evaluation obtained.  Family wants to take patient home.  UTI with Proteus mirabilis -UA showed moderate hemoglobin, trace leukocytes, rare bacteria, 6-10  WBCs.  -Urine culture grew more than 1000 CFU per mL of Proteus mirabilis and 70,000 CFU per mL of Enterococcus faecalis. -UTI could have been the cause of his altered mentation and syncope. -Patient also had one episode of fever of 101.4 on 2/16.  No recurrence of fever since then. -Patient has been started on oral amoxicillin.  Continue 5 more days with probiotics.  AKI on CKD stage IIIb -Most recent Cr 1.6 about two years ago -Creatinine on admission was elevated to 2.31. -Renal ultrasound showed medical renal disease. -With IV hydration, creatinine level is improving, 1.70 today.  Acute hypernatremia -Sodium level was reported to be significantly elevated to 174 on admission.  However repeat sodium level in 2 hours reported sodium level at 146.  I think the initial reading probably was 147 reported erroneously as 174.  -Sodium level has subsequently improved to normal range.  History of CVA with right sided weakness -PT/OT eval obtained.  Dementia -Seems to be advanced dementia.  Continue supportive care.  Stable for discharge to home today.  Subjective:  Patient was seen and examined this morning.  Elderly male.  Not in distress.  Cheerful demented.  Discussed with family.  Discharge Exam:   Vitals:   10/01/19 1300 10/01/19 2105 10/02/19 0409 10/02/19 0842  BP:  (!) 170/68 (!) 156/65 (!) 145/75  Pulse:  (!) 102 (!) 102 100  Resp:  18 16 18   Temp:  98.7 F (37.1 C) 98.8 F (37.1 C) 99 F (37.2 C)  TempSrc:  Oral Oral Oral  SpO2:  100% 100% 100%  Weight: 61.9 kg 61.9 kg    Height: 5\' 2"  (1.575 m)  Body mass index is 24.95 kg/m.  General exam: Appears calm and comfortable.  Skin: No rashes, lesions or ulcers. HEENT: Atraumatic, normocephalic, supple neck, no obvious bleeding Lungs: Clear to auscultation bilaterally CVS: Regular rate and rhythm, no murmur GI/Abd soft, nontender, nondistended, bowel sound present CNS: Alert, awake, able to answer simple  questions and follow commands Psychiatry: Mood appropriate Extremities: No pedal edema, no calf tenderness  Discharge Instructions:  Wound care: None Discharge Instructions    Diet general   Complete by: As directed    Face-to-face encounter (required for Medicare/Medicaid patients)   Complete by: As directed    I Varina Hulon certify that this patient is under my care and that I, or a nurse practitioner or physician's assistant working with me, had a face-to-face encounter that meets the physician face-to-face encounter requirements with this patient on 10/01/2019. The encounter with the patient was in whole, or in part for the following medical condition(s) which is the primary reason for home health care (List medical condition): Dementia, impaired mobility, chronic kidney disease   The encounter with the patient was in whole, or in part, for the following medical condition, which is the primary reason for home health care: Dementia, impaired mobility, chronic kidney disease   I certify that, based on my findings, the following services are medically necessary home health services: Physical therapy   Reason for Medically Necessary Home Health Services: Skilled Nursing- Change/Decline in Patient Status   My clinical findings support the need for the above services: Unsafe ambulation due to balance issues   Further, I certify that my clinical findings support that this patient is homebound due to: Unable to leave home safely without assistance   For home use only DME lightweight manual wheelchair with seat cushion   Complete by: As directed    Patient suffers from dementia, impaired mobility which impairs their ability to perform daily activities like bathing, dressing, feeding and grooming in the home.  A walker will not resolve  issue with performing activities of daily living. A wheelchair will allow patient to safely perform daily activities. Patient is not able to propel themselves in the  home using a standard weight wheelchair due to arm weakness. Patient can self propel in the lightweight wheelchair. Length of need Lifetime. Accessories: elevating leg rests (ELRs), wheel locks, extensions and anti-tippers.   Home Health   Complete by: As directed    To provide the following care/treatments:  PT OT Social work     Increase activity slowly   Complete by: As directed      Follow-up Information    [facility] Follow up.   Specialty: Home Health Services Why: the office will call to schedule visits for physical therapy and occupational therapy and social worker Contact information: [address] [place] [address] [place] [address] [phone]        [facility]. Schedule an appointment as soon as possible for a visit.   Why: to establish primary care [address] Open  Closes 5PM  [phone]       [facility] Follow up.   Why: provided wheelchair         Allergies as of 10/02/2019   No Known Allergies     Medication List    TAKE these medications   acetaminophen 500 MG tablet Commonly known as: TYLENOL Take 1,000 mg by mouth as needed for mild pain, moderate pain, fever or headache.   amoxicillin 500 MG  capsule Commonly known as: AMOXIL Take 1 capsule (500 mg total) by mouth every 12 (twelve) hours for 5 days.   aspirin 81 MG chewable tablet Chew 81 mg by mouth daily.   MULTIVITAMIN ADULT PO Take 1 tablet by mouth daily.   saccharomyces boulardii 250 MG capsule Commonly known as: FLORASTOR Take 1 capsule (250 mg total) by mouth 2 (two) times daily for 7 days.   VITAMIN B12 PO Take 1 tablet by mouth daily.            Durable Medical Equipment  (From admission, onward)         Start     Ordered   10/01/19 0000  For home use only DME lightweight manual wheelchair with seat cushion    Comments: Patient suffers from dementia, impaired mobility which impairs their ability to perform daily activities like  bathing, dressing, feeding and grooming in the home.  A walker will not resolve  issue with performing activities of daily living. A wheelchair will allow patient to safely perform daily activities. Patient is not able to propel themselves in the home using a standard weight wheelchair due to arm weakness. Patient can self propel in the lightweight wheelchair. Length of need Lifetime. Accessories: elevating leg rests (ELRs), wheel locks, extensions and anti-tippers.   10/01/19 1310          Time coordinating discharge: 35 minutes  The results of significant diagnostics from this hospitalization (including imaging, microbiology, ancillary and laboratory) are listed below for reference.    Procedures and Diagnostic Studies:   X-ray chest PA and lateral  Result Date: 09/28/2019 CLINICAL DATA:  Syncopal episode after receiving a COVID vaccine today. EXAM: CHEST - 2 VIEW COMPARISON:  None. FINDINGS: The lungs are clear. Heart size is normal. No pneumothorax or pleural fluid. No acute or focal bony abnormality. IMPRESSION: Negative chest. Electronically Signed   By: Drusilla Kanner M.D.   On: 09/28/2019 17:50   CT HEAD WO CONTRAST  Result Date: 09/29/2019 CLINICAL DATA:  Neuro deficit(s), subacute Neuro deficit, acute, stroke suspected EXAM: CT HEAD WITHOUT CONTRAST TECHNIQUE: Contiguous axial images were obtained from the base of the skull through the vertex without intravenous contrast. COMPARISON:  None. FINDINGS: Brain: There is severe generalized atrophy a large area encephalomalacia at the site of a remote left posterior cerebral artery territory infarct. There is ex vacuo dilatation ventricles. No intracranial hemorrhage or extra-axial collection. No evidence of acute cortical infarct. Vascular: There is atherosclerotic calcification of the internal carotid and vertebral arteries at the skull base. Skull: Normal. Negative for fracture or focal lesion. Sinuses/Orbits: Moderate right maxillary  sinus mucosal thickening. 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 Other: None IMPRESSION: 1. No acute intracranial abnormality. 2. 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 Electronically Signed   By: Deatra Robinson M.D.   On: 09/29/2019 20:56   US RENAL  Result Date: 09/28/2019 CLINICAL DATA:  Acute kidney injury EXAM: RENAL / URINARY TRACT ULTRASOUND COMPLETE COMPARISON:  None. FINDINGS: Right Kidney: Renal measurements: 10.9 x 6.3 x 6.5 cm = volume: 231 mL. Echogenic renal parenchyma, suggesting medical renal disease. 2.9 x 2.5 x 2.0 cm simple upper pole cyst. 5.6 x 5.0 x 4.8 cm lobulated cystic lesion in the right renal pelvis favors a renal sinus cyst given the appearance, although this is not usually distinguished from hydronephrosis on the provided study. Mild associated hydronephrosis is suspected. Left Kidney: Renal measurements: 8.7 x 5.3 x 4.4 cm =  volume: 105 mL. Echogenic renal parenchyma, suggesting medical renal disease. 3.0 x 2.6 x 2.5 cm upper pole cysts, poorly visualized. No hydronephrosis. Bladder: Appears normal for degree of bladder distention. Other: None. IMPRESSION: Echogenic renal parenchyma, suggesting medical renal disease. Bilateral renal cysts, including a suspected 5.6 cm right renal sinus cyst, although this is poorly distinguished from hydronephrosis on the provided study. Mild associated right hydronephrosis is suspected. Electronically Signed   By: Charline BillsSriyesh  Krishnan M.D.   On: 09/28/2019 18:50   ECHOCARDIOGRAM COMPLETE  Result Date: 09/29/2019    ECHOCARDIOGRAM REPORT   Patient Name:   Rodney Perkins Date of Exam: 09/29/2019 Medical Rec #:  [id number]    Height: Accession #:    [id number]   Weight: Date of Birth:  11/13/1923     BSA: Patient Age:    86 years     BP:           122/54 mmHg Patient Gender: M            HR:           89 bpm. Exam Location:  Inpatient Procedure: 2D Echo, Cardiac Doppler and Color Doppler Indications:     Syncope  History:        Patient has no prior history of Echocardiogram examinations.                 Signs/Symptoms:Syncope and Alzheimer's.  Sonographer:    Lavenia AtlasBrooke Strickland Referring Phys: [id number] PING T Chipper HerbZHANG  Sonographer Comments: Technically difficult study due to poor echo windows. Image acquisition challenging due to respiratory motion. IMPRESSIONS  1. No evidence of outflow tract gradient.  2. Left ventricular ejection fraction, by estimation, is 70 to 75%. The left ventricle has hyperdynamic function. The left ventricle has no regional wall motion abnormalities. There is mild left ventricular hypertrophy. Left ventricular diastolic parameters are consistent with Grade I diastolic dysfunction (impaired relaxation).  3. Right ventricular systolic function is normal. The right ventricular size is normal.  4. The mitral valve is normal in structure and function. No evidence of mitral valve regurgitation. No evidence of mitral stenosis.  5. The aortic valve is normal in structure and function. Aortic valve regurgitation is not visualized. Mild aortic valve sclerosis is present, with no evidence of aortic valve stenosis.  6. The inferior vena cava is normal in size with greater than 50% respiratory variability, suggesting right atrial pressure of 3 mmHg. FINDINGS  Left Ventricle: Left ventricular ejection fraction, by estimation, is 70 to 75%. The left ventricle has hyperdynamic function. The left ventricle has no regional wall motion abnormalities. The left ventricular internal cavity size was normal in size. There is mild left ventricular hypertrophy. Left ventricular diastolic parameters are consistent with Grade I diastolic dysfunction (impaired relaxation). Right Ventricle: The right ventricular size is normal. No increase in right ventricular wall thickness. Right ventricular systolic function is normal. Left Atrium: Left atrial size was normal in size. Right Atrium: Right atrial size was normal in size.  Pericardium: There is no evidence of pericardial effusion. Mitral Valve: The mitral valve is normal in structure and function. Normal mobility of the mitral valve leaflets. No evidence of mitral valve regurgitation. No evidence of mitral valve stenosis. Tricuspid Valve: The tricuspid valve is normal in structure. Tricuspid valve regurgitation is not demonstrated. No evidence of tricuspid stenosis. Aortic Valve: The aortic valve is normal in structure and function. Aortic valve regurgitation is not visualized. Mild aortic valve sclerosis is present, with no evidence of  aortic valve stenosis. Mild aortic valve annular calcification. Pulmonic Valve: The pulmonic valve was normal in structure. Pulmonic valve regurgitation is not visualized. No evidence of pulmonic stenosis. Aorta: The aortic root is normal in size and structure. Venous: The inferior vena cava is normal in size with greater than 50% respiratory variability, suggesting right atrial pressure of 3 mmHg. IAS/Shunts: No atrial level shunt detected by color flow Doppler. Additional Comments: No evidence of outflow tract gradient.  LEFT VENTRICLE PLAX 2D LVIDd:         3.86 cm  Diastology LVIDs:         2.90 cm  LV e' lateral:   6.64 cm/s LV PW:         1.33 cm  LV E/e' lateral: 12.1 LV IVS:        1.14 cm  LV e' medial:    4.68 cm/s LVOT diam:     1.90 cm  LV E/e' medial:  17.1 LV SV:         53.02 ml LVOT Area:     2.84 cm  RIGHT VENTRICLE RV Basal diam:  2.29 cm RV S prime:     9.03 cm/s TAPSE (M-mode): 2.4 cm LEFT ATRIUM             RIGHT ATRIUM LA diam:        4.20 cm RA Area:     13.90 cm LA Vol (A2C):   25.9 ml RA Volume:   35.90 ml LA Vol (A4C):   32.3 ml LA Biplane Vol: 28.9 ml  AORTIC VALVE LVOT Vmax:   92.70 cm/s LVOT Vmean:  60.600 cm/s LVOT VTI:    0.187 m  AORTA Ao Root diam: 3.40 cm MITRAL VALVE MV Area (PHT): 4.21 cm    SHUNTS MV Decel Time: 180 msec    Systemic VTI:  0.19 m MV E velocity: 80.10 cm/s  Systemic Diam: 1.90 cm MV A velocity:  93.00 cm/s MV E/A ratio:  0.86 Donato SchultzMark Skains MD Electronically signed by Donato SchultzMark Skains MD Signature Date/Time: 09/29/2019/11:12:03 AM    Final      Labs:   Basic Metabolic Panel: Recent Labs  Lab 09/28/19 1335 09/28/19 1335 09/29/19 0525 09/29/19 [id number] 09/29/19 [id number] 09/30/19 [id number] 09/30/19 0538 10/01/19 0352 10/02/19 0326  NA 146*  --  146*  --   --  142  --  143 147*  K 4.7   < > 3.7   < >  --  3.4*   < > 3.7 3.8  CL 112*  --  114*  --   --  113*  --  113* 114*  CO2 21*  --  23  --   --  22  --  21* 23  GLUCOSE 111*  --  96  --   --  105*  --  101* 94  BUN 41*  --  33*  --   --  29*  --  26* 26*  CREATININE 2.39*  --  2.19*  --   --  2.03*  --  1.79* 1.70*  CALCIUM 8.9  --  8.4*  --   --  8.1*  --  8.1* 8.4*  MG  --   --   --   --  1.9 1.7  --   --   --   PHOS  --   --   --   --  3.8 3.3  --   --   --    < > =  values in this interval not displayed.   GFR Estimated Creatinine Clearance: 21 mL/min (A) (by C-G formula based on SCr of 1.7 mg/dL (H)). Liver Function Tests: Recent Labs  Lab 09/29/19 0841 09/30/19 0538  AST 14* 14*  ALT 10 10  ALKPHOS 65 56  BILITOT 0.8 0.5  PROT 5.5* 5.1*  ALBUMIN 2.8* 2.6*   No results for input(s): LIPASE, AMYLASE in the last 168 hours. No results for input(s): AMMONIA in the last 168 hours. Coagulation profile No results for input(s): INR, PROTIME in the last 168 hours.  CBC: Recent Labs  Lab 09/28/19 1337 09/29/19 0841 09/30/19 0538 10/01/19 0352 10/02/19 0326  WBC 12.0* 7.0 5.4 4.7 7.9  NEUTROABS 10.4* 4.4 3.1 2.2 4.6  HGB 11.2* 9.5* 9.1* 9.0* 10.4*  HCT 34.9* 29.6* 28.7* 27.6* 31.9*  MCV 97.8 99.3 100.0 98.2 97.9  PLT 251 206 188 183 218   Cardiac Enzymes: No results for input(s): CKTOTAL, CKMB, CKMBINDEX, TROPONINI in the last 168 hours. BNP: Invalid input(s): POCBNP CBG: Recent Labs  Lab 09/30/19 0730 09/30/19 1131 10/01/19 0728 10/01/19 1106 10/02/19 0520  GLUCAP 99 105* 84 84 79   D-Dimer No results for  input(s): DDIMER in the last 72 hours. Hgb A1c No results for input(s): HGBA1C in the last 72 hours. Lipid Profile No results for input(s): CHOL, HDL, LDLCALC, TRIG, CHOLHDL, LDLDIRECT in the last 72 hours. Thyroid function studies No results for input(s): TSH, T4TOTAL, T3FREE, THYROIDAB in the last 72 hours.  Invalid input(s): FREET3 Anemia work up Recent Labs    09/30/19 0538  VITAMINB12 >7,500*  FOLATE 22.7  FERRITIN 318  TIBC 167*  IRON 26*  RETICCTPCT 1.4   Microbiology Recent Results (from the past 240 hour(s))  Respiratory Panel by RT PCR (Flu A&B, Covid) - Nasopharyngeal Swab     Status: None   Collection Time: 09/28/19  5:25 PM   Specimen: Nasopharyngeal Swab  Result Value Ref Range Status   SARS Coronavirus 2 by RT PCR NEGATIVE NEGATIVE Final    Comment: (NOTE) SARS-CoV-2 target nucleic acids are NOT DETECTED. The SARS-CoV-2 RNA is generally detectable in upper respiratoy specimens during the acute phase of infection. The lowest concentration of SARS-CoV-2 viral copies this assay can detect is 131 copies/mL. A negative result does not preclude SARS-Cov-2 infection and should not be used as the sole basis for treatment or other patient management decisions. A negative result may occur with  improper specimen collection/handling, submission of specimen other than nasopharyngeal swab, presence of viral mutation(s) within the areas targeted by this assay, and inadequate number of viral copies (<131 copies/mL). A negative result must be combined with clinical observations, patient history, and epidemiological information. The expected result is Negative. Fact Sheet for Patients:  https://www.moore.com/ Fact Sheet for Healthcare Providers:  https://www.young.biz/ This test is not yet ap proved or cleared by the [place] FDA and  has been authorized for detection and/or diagnosis of SARS-CoV-2 by FDA under an Emergency Use  Authorization (EUA). This EUA will remain  in effect (meaning this test can be used) for the duration of the COVID-19 declaration under Section 564(b)(1) of the Act, 21 U.S.C. section 360bbb-3(b)(1), unless the authorization is terminated or revoked sooner.    Influenza A by PCR NEGATIVE NEGATIVE Final   Influenza B by PCR NEGATIVE NEGATIVE Final    Comment: (NOTE) The Xpert Xpress SARS-CoV-2/FLU/RSV assay is intended as an aid in  the diagnosis of influenza from Nasopharyngeal swab specimens and  should not  be used as a sole basis for treatment. Nasal washings and  aspirates are unacceptable for Xpert Xpress SARS-CoV-2/FLU/RSV  testing. Fact Sheet for Patients: https://www.moore.com/ Fact Sheet for Healthcare Providers: https://www.young.biz/ This test is not yet approved or cleared by the [place] FDA and  has been authorized for detection and/or diagnosis of SARS-CoV-2 by  FDA under an Emergency Use Authorization (EUA). This EUA will remain  in effect (meaning this test can be used) for the duration of the  Covid-19 declaration under Section 564(b)(1) of the Act, 21  U.S.C. section 360bbb-3(b)(1), unless the authorization is  terminated or revoked. Performed at [facility], [address], [place], [place] [address]   Culture, blood (routine x 2)     Status: None (Preliminary result)   Collection Time: 09/29/19  8:44 AM   Specimen: BLOOD  Result Value Ref Range Status   Specimen Description BLOOD LEFT ANTECUBITAL  Final   Special Requests   Final    BOTTLES DRAWN AEROBIC ONLY Blood Culture results may not be optimal due to an inadequate volume of blood received in culture bottles   Culture   Final    NO GROWTH 3 DAYS Performed at [facility], [address], [place], [place] [address]    Report Status PENDING  Incomplete  Culture, blood (routine x 2)     Status: None (Preliminary result)   Collection Time: 09/29/19   8:48 AM   Specimen: BLOOD  Result Value Ref Range Status   Specimen Description BLOOD LEFT ANTECUBITAL  Final   Special Requests   Final    BOTTLES DRAWN AEROBIC ONLY Blood Culture results may not be optimal due to an inadequate volume of blood received in culture bottles   Culture   Final    NO GROWTH 3 DAYS Performed at [facility], [address] [place], [place] [address]    Report Status PENDING  Incomplete  Culture, Urine     Status: Abnormal   Collection Time: 09/29/19  3:00 PM   Specimen: Urine, Random  Result Value Ref Range Status   Specimen Description URINE, RANDOM  Final   Special Requests   Final    NONE Performed at [facility], [address], [place], [place] [address]    Culture (A)  Final    70,000 COLONIES/mL ENTEROCOCCUS FAECALIS >=100,000 COLONIES/mL PROTEUS MIRABILIS    Report Status 10/01/2019 FINAL  Final   Organism ID, Bacteria ENTEROCOCCUS FAECALIS (A)  Final   Organism ID, Bacteria PROTEUS MIRABILIS (A)  Final      Susceptibility   Enterococcus faecalis - MIC*    AMPICILLIN <=2 SENSITIVE Sensitive     NITROFURANTOIN <=16 SENSITIVE Sensitive     VANCOMYCIN 1 SENSITIVE Sensitive     * 70,000 COLONIES/mL ENTEROCOCCUS FAECALIS   Proteus mirabilis - MIC*    AMPICILLIN 4 SENSITIVE Sensitive     CEFAZOLIN <=4 SENSITIVE Sensitive     CEFTRIAXONE <=0.25 SENSITIVE Sensitive     CIPROFLOXACIN <=0.25 SENSITIVE Sensitive     GENTAMICIN <=1 SENSITIVE Sensitive     IMIPENEM <=0.25 SENSITIVE Sensitive     NITROFURANTOIN 128 RESISTANT Resistant     TRIMETH/SULFA <=20 SENSITIVE Sensitive     AMPICILLIN/SULBACTAM <=2 SENSITIVE Sensitive     * >=100,000 COLONIES/mL PROTEUS MIRABILIS    Please note: You were cared for by a hospitalist during your hospital stay. Once you are discharged, your primary care physician will handle any further medical  issues. Please note that NO REFILLS for any discharge medications will be authorized once you are  discharged, as it is imperative that you return to your primary care physician (or establish a relationship with a primary care physician if you do not have one) for your post hospital discharge needs so that they can reassess your need for medications and monitor your lab values.  Signed: Marlowe Aschoff Cassandra Mcmanaman  [facility] 10/02/2019, 12:39 PM

## 2019-10-02 NOTE — Progress Notes (Signed)
  Speech Language Pathology Treatment: Dysphagia  Patient Details Name: Rodney Perkins MRN: [id number] DOB: 01-06-1924 Today's Date: 10/02/2019 Time: 1324-4010 SLP Time Calculation (min) (ACUTE ONLY): 14 min  Assessment / Plan / Recommendation Clinical Impression  Pt encountered in room, daughter present at bedside. Patient discharging home with daughter today. Daughter reports PTA patient was eating solids that she "cut up into small bite sizes" for him.  Pt alert, able to state name when prompted. Daughter placed patient's dentures prior to PO trials with ST. Patient seen with thin liquids via straw sips, puree solids and chopped solids (chopped peaches). Pt with moderately prolonged mastication of peaches, characterized by a "munch chew" pattern. Pt able to form cohesive bolus and initiate the swallow, no overt s/sx aspiration and good oral clearance achieved. No overt s/sx aspiration observed with any consistency seen today. Daughter educated re: diet recommendations, s/sx dysphagia to watch for, recommendations to give patient small bites/sips, check for any pocketing. She verbalized understanding.  Recommend diet upgrade to dysphagia 3, thin liquids. Patient should only eat when alert/upright, small bites/sips, solids when dentures placed.   ST to sign off. No further ST is warranted at this time.   HPI HPI: 86 y.o. male with medical history significant of stroke, advanced Alzheimer's, vasovagal syncope, who presents to ER for evaluation of one syncopal episode after second covid vaccine. CXR = lungs clear. HeadCT = No acute intracranial abnormality. Old left posterior cerebral artery territory infarct and severegeneralized atrophy.      SLP Plan  Discharge SLP treatment due to (comment)       Recommendations  Diet recommendations: Dysphagia 3 (mechanical soft);Thin liquid Liquids provided via: Cup;Straw Medication Administration: Crushed with puree Supervision: Intermittent supervision  to cue for compensatory strategies;Patient able to self feed Compensations: Minimize environmental distractions;Small sips/bites;Slow rate Postural Changes and/or Swallow Maneuvers: Seated upright 90 degrees;Upright 30-60 min after meal                Oral Care Recommendations: Oral care BID Follow up Recommendations: 24 hour supervision/assistance SLP Visit Diagnosis: Dysphagia, unspecified (R13.10) Plan: Discharge SLP treatment due to (comment)       GO                Cecile Gillispie 10/02/2019, 12:21 PM Shella Spearing, M.Ed., CCC-SLP Speech Therapy Acute Rehabilitation [phone]: Acute Rehab office [phone] - pager

## 2019-10-02 NOTE — Care Management (Signed)
Spoke w his daughter on the phone. She is at home ready for him. Address verified. I called [facility] and printed the forms to the unit printer and notified the nurse that Sharin Mons is on the way.

## 2019-10-02 NOTE — Plan of Care (Signed)
  Problem: Health Behavior/Discharge Planning: Goal: Ability to manage health-related needs will improve Outcome: Progressing   
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
## 2019-10-04 LAB — CULTURE, BLOOD (ROUTINE X 2)
Culture: NO GROWTH
Culture: NO GROWTH

## 2020-03-03 ENCOUNTER — Telehealth: Payer: Self-pay | Admitting: Hospice

## 2020-03-03 NOTE — Telephone Encounter (Signed)
Scheduled in person visit for 03-10-20 at 8:30.

## 2020-03-10 ENCOUNTER — Other Ambulatory Visit: Payer: Self-pay

## 2020-03-10 ENCOUNTER — Other Ambulatory Visit: Payer: Medicare Other | Admitting: Hospice

## 2020-03-10 DIAGNOSIS — Z515 Encounter for palliative care: Secondary | ICD-10-CM

## 2020-03-10 NOTE — Progress Notes (Signed)
Therapist, nutritional Palliative Care Consult Note Telephone: [phone]  Fax: [phone]  PATIENT NAME: Rodney Perkins DOB: May 25, 1924 MRN: [id number]  PRIMARY CARE PROVIDER:   Dr. Karl Ito  REFERRING PROVIDER: Dr. Karl Ito  RESPONSIBLE PARTY:  Rodney Perkins [phone]    RECOMMENDATIONS/PLAN:   Advance Care Planning/Goals of Care: Visit at the request of Dr. Karl Ito   for palliative consult. Visit consisted of building trust and discussions on Palliative Medicine as specialized medical care for people living with serious illness, aimed at facilitating better quality of life through symptoms relief, assisting with advance care plan and establishing goals of care.  Rodney Perkins affirmed that patient is a DO NOT RESUSCITATE.  DNR form signed for patient; same document uploaded to epic.  Discussions on goals of care clarification using medical advice for scope of treatment(MOST) form.  MOST  selections today include no feeding tube antibiotics if indicated, IV fluids if indicated, comfort measures, do not transfer to hospital.  Signed MOST form left with patient. Most form uploaded to epic.  Goals of care include to maximize quality of life and symptom management.  Family is open to hospice services in the future.  Follow up: Palliative care will continue to follow patient for goals of care clarification and symptom management.  Next visit in 1 month Symptom management:Patient in no acute distress. Ongoing confusion/memory loss related to Dementia, incontinent of bowel and bladder, total care, bed bound FAST 7b. Patient with history vasovagal syndrome; no recent event.  Left-sided weakness related to previous stroke is ongoing. Rodney Perkins does exercises and passive range of motion for patient.  Patient is able to feed self after set up.  He denies pain/discomfort no coughing, no respiratory distress.  Patient lives at home with Rodney Perkins.  He has no medications at this  time.  Rodney Perkins with no concerns. Encouraged ongoing care.  I spent 1hour 35 minutes providing this initial consultation; time iincludes time spent with patient/family, chart review, provider coordination,  and documentation. More than 50% of the time in this consultation was spent on coordinating communication  HISTORY OF PRESENT ILLNESS:  Rodney Perkins is a 86 y.o. year old male with multiple medical problems including stroke with left side weakness, Alzheimer's Dementia, stage 4 kidney disease, vaso vagal syndrome. Palliative Care was asked to help address goals of care.   CODE STATUS: DNR  PPS: 30% HOSPICE ELIGIBILITY/DIAGNOSIS: TBD  PAST MEDICAL HISTORY:  Past Medical History:  Diagnosis Date  . Alzheimer's dementia (HCC)   . Stroke (HCC)   . Vagal reaction     SOCIAL HX:  Social History   Tobacco Use  . Smoking status: Not on file  Substance Use Topics  . Alcohol use: Not on file    ALLERGIES: No Known Allergies   PERTINENT MEDICATIONS:  Outpatient Encounter Medications as of 03/10/2020  Medication Sig  . acetaminophen (TYLENOL) 500 MG tablet Take 1,000 mg by mouth as needed for mild pain, moderate pain, fever or headache.  Marland Kitchen aspirin 81 MG chewable tablet Chew 81 mg by mouth daily.  . Cyanocobalamin (VITAMIN B12 PO) Take 1 tablet by mouth daily.  . Multiple Vitamin (MULTIVITAMIN ADULT PO) Take 1 tablet by mouth daily.   No facility-administered encounter medications on file as of 03/10/2020.    PHYSICAL EXAM/ROS:  General: NAD, cooperative Cardiovascular: regular rate and rhythm; denies chest pain Pulmonary: clear ant fields, normal respiratory effort, no coughing, no shortness of breath Abdomen: soft, nontender, +  bowel sounds GU: no suprapubic tenderness Extremities: no edema, no joint deformities Skin: no rashes to exposed skin Neurological: Weakness but otherwise nonfocal  Rosaura Carpenter, NP

## 2020-03-30 ENCOUNTER — Other Ambulatory Visit: Payer: Medicare Other | Admitting: Hospice

## 2020-03-30 ENCOUNTER — Other Ambulatory Visit: Payer: Self-pay

## 2020-03-30 DIAGNOSIS — F0391 Unspecified dementia with behavioral disturbance: Secondary | ICD-10-CM

## 2020-03-30 DIAGNOSIS — Z515 Encounter for palliative care: Secondary | ICD-10-CM

## 2020-03-30 NOTE — Progress Notes (Signed)
Therapist, nutritional Palliative Care Consult Note Telephone: [phone]  Fax: [phone]  PATIENT NAME: Rodney Perkins DOB: August 23, 1923 MRN: [id number] PRIMARY CARE PROVIDER:   Dr. Karl Ito  REFERRING PROVIDER: Dr. Karl Ito RESPONSIBLE PARTY:  [name] [phone]  TELEHEALTH VISIT STATEMENT Due to the COVID-19 crisis, this visit was done via telephone from my office. It was initiated and consented to by this patient and/or family.   RECOMMENDATIONS/PLAN:   Advance Care Planning/Goals of Care: Telehealth visit at the request of daughter [name] for Patient's change in condition.  Goals of Care: Goals of care include to maximize quality of life and symptom management. Code Status: Patient is a DO  NOT RESUSCITATE. Medical orders scope of treatment  MOST  selections  include no feeding tube antibiotics if indicated, IV fluids if indicated, comfort measures, do not transfer to hospital. Both DNR and MOST forms in Epic.  Family is open to hospice services in the future.  Follow up: Palliative care will continue to follow patient for goals of care clarification and symptom management.  Next visit in 2 weeks.  Symptom management: Patient with increasing agitation and not able to sleep at night despite use of 10mg  Melatonin per daughter; Patient denied urinary symptoms. This is  likely related to Dementia sundawning. NP called PCP' s office - clinical staff [name]- with recommendations for Seroquel 25mg  at pm, with room to increase dose as needed/appropriate. [name] would confer with PCP and call in prescription to patient's pharmacy on file. NP's call back number with if needed for further discussion or clarifications. NP later called and she said prescription had been sent and she was going to pick it up. Ongoing confusion/memory loss related to Dementia, incontinent of bowel and bladder, total care, bed bound FAST 7b. Patient with history vasovagal  syndrome; no recent event.  Left-sided weakness related to previous stroke is ongoing. [name] does exercises and passive range of motion for patient.  Patient is able to feed self after set up.  He denies pain/discomfort no coughing, no respiratory distress.  Patient lives at home with [name].   I spent 46 minutes providing this initial consultation; time iincludes time spent with patient/family, chart review, provider coordination,  and documentation. More than 50% of the time in this consultation was spent on coordinating communication  HISTORY OF PRESENT ILLNESS:  Rodney Perkins is a 86 y.o. year old male with multiple medical problems including stroke with left side weakness, Alzheimer's Dementia, stage 4 kidney disease, vaso vagal syndrome. Palliative Care was asked to help address goals of care.   CODE STATUS: DNR  PPS: 30% HOSPICE ELIGIBILITY: TBD  PAST MEDICAL HISTORY:  Past Medical History:  Diagnosis Date  . Alzheimer's dementia (HCC)   . Stroke (HCC)   . Vagal reaction     SOCIAL HX:  Social History   Tobacco Use  . Smoking status: Not on file  Substance Use Topics  . Alcohol use: Not on file    ALLERGIES: No Known Allergies   PERTINENT MEDICATIONS:  Outpatient Encounter Medications as of 03/30/2020  Medication Sig  . acetaminophen (TYLENOL) 500 MG tablet Take 1,000 mg by mouth as needed for mild pain, moderate pain, fever or headache.  97 aspirin 81 MG chewable tablet Chew 81 mg by mouth daily.  . Cyanocobalamin (VITAMIN B12 PO) Take 1 tablet by mouth daily.  . Multiple Vitamin (MULTIVITAMIN ADULT PO) Take 1 tablet by mouth daily.   No facility-administered  encounter medications on file as of 03/30/2020.     Rosaura Carpenter, NP

## 2020-04-04 ENCOUNTER — Emergency Department (HOSPITAL_BASED_OUTPATIENT_CLINIC_OR_DEPARTMENT_OTHER): Payer: Medicare Other

## 2020-04-04 ENCOUNTER — Emergency Department (HOSPITAL_COMMUNITY)
Admission: EM | Admit: 2020-04-04 | Discharge: 2020-04-04 | Disposition: A | Discharge status: Home / Self Care | Payer: Medicare Other | Attending: Emergency Medicine | Admitting: Emergency Medicine

## 2020-04-04 DIAGNOSIS — Z7982 Long term (current) use of aspirin: Secondary | ICD-10-CM | POA: Insufficient documentation

## 2020-04-04 DIAGNOSIS — G309 Alzheimer's disease, unspecified: Secondary | ICD-10-CM | POA: Diagnosis not present

## 2020-04-04 DIAGNOSIS — R55 Syncope and collapse: Secondary | ICD-10-CM

## 2020-04-04 DIAGNOSIS — Z8673 Personal history of transient ischemic attack (TIA), and cerebral infarction without residual deficits: Secondary | ICD-10-CM | POA: Diagnosis not present

## 2020-04-04 LAB — CBC WITH DIFFERENTIAL/PLATELET
Abs Immature Granulocytes: 0.01 10*3/uL (ref 0.00–0.07)
Basophils Absolute: 0 10*3/uL (ref 0.0–0.1)
Basophils Relative: 0 %
Eosinophils Absolute: 0.4 10*3/uL (ref 0.0–0.5)
Eosinophils Relative: 7 %
HCT: 34.2 % — ABNORMAL LOW (ref 39.0–52.0)
Hemoglobin: 10.4 g/dL — ABNORMAL LOW (ref 13.0–17.0)
Immature Granulocytes: 0 %
Lymphocytes Relative: 33 %
Lymphs Abs: 2 10*3/uL (ref 0.7–4.0)
MCH: 30.9 pg (ref 26.0–34.0)
MCHC: 30.4 g/dL (ref 30.0–36.0)
MCV: 101.5 fL — ABNORMAL HIGH (ref 80.0–100.0)
Monocytes Absolute: 0.4 10*3/uL (ref 0.1–1.0)
Monocytes Relative: 7 %
Neutro Abs: 3.2 10*3/uL (ref 1.7–7.7)
Neutrophils Relative %: 53 %
Platelets: 233 10*3/uL (ref 150–400)
RBC: 3.37 MIL/uL — ABNORMAL LOW (ref 4.22–5.81)
RDW: 12 % (ref 11.5–15.5)
WBC: 6 10*3/uL (ref 4.0–10.5)
nRBC: 0 % (ref 0.0–0.2)

## 2020-04-04 LAB — COMPREHENSIVE METABOLIC PANEL
ALT: 21 U/L (ref 0–44)
AST: 19 U/L (ref 15–41)
Albumin: 3.3 g/dL — ABNORMAL LOW (ref 3.5–5.0)
Alkaline Phosphatase: 85 U/L (ref 38–126)
Anion gap: 7 (ref 5–15)
BUN: 43 mg/dL — ABNORMAL HIGH (ref 8–23)
CO2: 27 mmol/L (ref 22–32)
Calcium: 8.8 mg/dL — ABNORMAL LOW (ref 8.9–10.3)
Chloride: 109 mmol/L (ref 98–111)
Creatinine, Ser: 2.01 mg/dL — ABNORMAL HIGH (ref 0.61–1.24)
GFR calc Af Amer: 32 mL/min — ABNORMAL LOW (ref >60)
GFR calc non Af Amer: 28 mL/min — ABNORMAL LOW (ref >60)
Glucose, Bld: 96 mg/dL (ref 70–99)
Potassium: 4.6 mmol/L (ref 3.5–5.1)
Sodium: 143 mmol/L (ref 135–145)
Total Bilirubin: 0.3 mg/dL (ref 0.3–1.2)
Total Protein: 6.4 g/dL — ABNORMAL LOW (ref 6.5–8.1)

## 2020-04-04 LAB — URINALYSIS, ROUTINE W REFLEX MICROSCOPIC
Bilirubin Urine: NEGATIVE
Glucose, UA: NEGATIVE mg/dL
Hgb urine dipstick: NEGATIVE
Ketones, ur: NEGATIVE mg/dL
Leukocytes,Ua: NEGATIVE
Nitrite: NEGATIVE
Protein, ur: NEGATIVE mg/dL
Specific Gravity, Urine: 1.01 (ref 1.005–1.030)
pH: 7 (ref 5.0–8.0)

## 2020-04-04 LAB — CBG MONITORING, ED: Glucose-Capillary: 96 mg/dL (ref 70–99)

## 2020-04-04 MED ORDER — LACTATED RINGERS IV BOLUS
1000.0000 mL | Freq: Once | INTRAVENOUS | Status: AC
  Administered 2020-04-04: 1000 mL via INTRAVENOUS

## 2020-04-04 NOTE — ED Notes (Signed)
Condom cath placed on pt for urine sample collection

## 2020-04-04 NOTE — ED Provider Notes (Signed)
[facility] EMERGENCY DEPARTMENT Provider Note   CSN: [id number] Arrival date & time: 04/04/20  1151     History No chief complaint on file.   Rodney Perkins is a 86 y.o. male.  86yo M w/ PMH including Alzheimer's dementia, stroke who p/w syncope. Per EMS, pt reportedly had a syncopal episode at home when he was finishing stretching exercises with daughter. She reports that he stood up to transfer to chair but then eyes rolled back in his head and he lost consciousness. No seizure like activity. She reports he got sweaty which worried her.   At the beginning of the month, he was started on seroquel for sundowning.  No other medication changes.  LEVEL 5 CAVEAT DUE TO DEMENTIA  The history is provided by the EMS personnel.       Past Medical History:  Diagnosis Date  . Alzheimer's dementia (HCC)   . Stroke (HCC)   . Vagal reaction     Patient Active Problem List   Diagnosis Date Noted  . Syncope, vasovagal 09/28/2019  . AKI (acute kidney injury) (HCC) 09/28/2019  . Syncope 09/28/2019    ** The histories are not reviewed yet. Please review them in the "History" navigator section and refresh this SmartLink.     No family history on file.  Social History   Tobacco Use  . Smoking status: Not on file  Substance Use Topics  . Alcohol use: Not on file  . Drug use: Not on file    Home Medications Prior to Admission medications   Medication Sig Start Date End Date Taking? Authorizing Provider  acetaminophen (TYLENOL) 500 MG tablet Take 1,000 mg by mouth as needed for mild pain, moderate pain, fever or headache.    [provider]  aspirin 81 MG chewable tablet Chew 81 mg by mouth daily.    [provider]  Cyanocobalamin (VITAMIN B12 PO) Take 1 tablet by mouth daily.    [provider]  Multiple Vitamin (MULTIVITAMIN ADULT PO) Take 1 tablet by mouth daily.    [provider]    Allergies    Patient has no known  allergies.  Review of Systems   Review of Systems  Unable to perform ROS: Dementia    Physical Exam Updated Vital Signs BP (!) 149/70   Pulse 79   Temp 98 F (36.7 C) (Axillary)   Resp 14   SpO2 93%   Physical Exam Vitals and nursing note reviewed.  Constitutional:      General: He is not in acute distress.    Appearance: He is well-developed.     Comments: Frail and chronically ill appearing  HENT:     Head: Normocephalic and atraumatic.     Mouth/Throat:     Comments: Edentulous Eyes:     Extraocular Movements: Extraocular movements intact.     Conjunctiva/sclera: Conjunctivae normal.     Pupils: Pupils are equal, round, and reactive to light.  Cardiovascular:     Rate and Rhythm: Normal rate and regular rhythm.     Heart sounds: Normal heart sounds.  Pulmonary:     Effort: Pulmonary effort is normal.     Breath sounds: Normal breath sounds.  Abdominal:     General: There is no distension.     Palpations: Abdomen is soft.     Tenderness: There is no abdominal tenderness.  Musculoskeletal:     Cervical back: Neck supple.     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  Neurological:     Mental Status: He is alert and oriented to person, place, and time.     Comments: Alert, follows basic commands but gets confused easily, R arm contracture, 2/5 strength BLE, 4/5 strength LUE; no obvious facial asymmetry     ED Results / Procedures / Treatments   Labs (all labs ordered are listed, but only abnormal results are displayed) Labs Reviewed  COMPREHENSIVE METABOLIC PANEL - Abnormal; Notable for the following components:      Result Value   BUN 43 (*)    Creatinine, Ser 2.01 (*)    Calcium 8.8 (*)    Total Protein 6.4 (*)    Albumin 3.3 (*)    GFR calc non Af Amer 28 (*)    GFR calc Af Amer 32 (*)    All other components within normal limits  CBC WITH DIFFERENTIAL/PLATELET - Abnormal; Notable for the  following components:   RBC 3.37 (*)    Hemoglobin 10.4 (*)    HCT 34.2 (*)    MCV 101.5 (*)    All other components within normal limits  URINALYSIS, ROUTINE W REFLEX MICROSCOPIC  CBG MONITORING, ED    EKG EKG Interpretation  Date/Time:  Monday April 04 2020 12:12:19 EDT Ventricular Rate:  79 PR Interval:    QRS Duration: 126 QT Interval:  438 QTC Calculation: 503 R Axis:   -24 Text Interpretation: Accelerated junctional rhythm Left bundle branch block Artifact in lead(s) I II III aVR aVL V1 V2 V3 artifact, overall similar to previous Confirmed by Frederick Peers [phone]) on 04/04/2020 1:09:13 PM   Radiology VAS US CAROTID  Result Date: 04/04/2020 Carotid Arterial Duplex Study Indications:       Syncope. Comparison Study:  No prior. Performing Technologist: Marilynne Halsted RDMS, RVT  Examination Guidelines: A complete evaluation includes B-mode imaging, spectral Doppler, color Doppler, and power Doppler as needed of all accessible portions of each vessel. Bilateral testing is considered an integral part of a complete examination. Limited examinations for reoccurring indications may be performed as noted.  Right Carotid Findings: +----------+--------+--------+--------+------------------+--------+           PSV cm/sEDV cm/sStenosisPlaque DescriptionComments +----------+--------+--------+--------+------------------+--------+ CCA Prox  51      12                                         +----------+--------+--------+--------+------------------+--------+ CCA Distal60      17                                         +----------+--------+--------+--------+------------------+--------+ ICA Prox  80      21      1-39%   heterogenous               +----------+--------+--------+--------+------------------+--------+ ICA Distal80      23                                         +----------+--------+--------+--------+------------------+--------+ ECA       386     45       >50%                               +----------+--------+--------+--------+------------------+--------+ +----------+--------+-------+----------------+-------------------+  PSV cm/sEDV cmsDescribe        Arm Pressure (mmHG) +----------+--------+-------+----------------+-------------------+ Subclavian117            Multiphasic, WNL                    +----------+--------+-------+----------------+-------------------+ +---------+--------+--------+--------------+ VertebralPSV cm/sEDV cm/sNot identified +---------+--------+--------+--------------+  Left Carotid Findings: +----------+--------+--------+--------+------------------+------------------+           PSV cm/sEDV cm/sStenosisPlaque DescriptionComments           +----------+--------+--------+--------+------------------+------------------+ CCA Prox  67      9                                                    +----------+--------+--------+--------+------------------+------------------+ CCA Distal40      9                                 intimal thickening +----------+--------+--------+--------+------------------+------------------+ ICA Prox                                            Not visualized     +----------+--------+--------+--------+------------------+------------------+ ICA Mid                                             Not visualized     +----------+--------+--------+--------+------------------+------------------+ ICA Distal                                          Not visualized     +----------+--------+--------+--------+------------------+------------------+ ECA       178     23                                                   +----------+--------+--------+--------+------------------+------------------+ +----------+--------+--------+----------------+-------------------+           PSV cm/sEDV cm/sDescribe        Arm Pressure (mmHG)  +----------+--------+--------+----------------+-------------------+ Subclavian160             Multiphasic, WNL                    +----------+--------+--------+----------------+-------------------+ +---------+--------+--+--------+--+ VertebralPSV cm/s41EDV cm/s15 +---------+--------+--+--------+--+ The left ICA was not clearly identified - possible occlusion.  Summary: Right Carotid: Velocities in the right ICA are consistent with a 1-39% stenosis.                The ECA appears >50% stenosed. Left Carotid: Technically difficult to visualize the left ICA on this exam.               Cannot exclude chronic occlusive disease. The external carotid               artery is patent.  *See table(s) above for measurements and observations.     Preliminary     Procedures Procedures (including critical care time)  Medications Ordered in ED Medications  lactated ringers bolus 1,000 mL (0 mLs Intravenous Stopped 04/04/20 1539)    ED Course  I have reviewed the triage vital signs and the nursing notes.  Pertinent labs & imaging results that were available during my care of the patient were reviewed by me and considered in my medical decision making (see chart for details).    MDM Rules/Calculators/A&P                          Patient comfortable on exam with reassuring vital signs. EKG reassuring.  Lab work shows reassuring CMP with creatinine similar to previous, reassuring CBC.  Reviewed echo from earlier this year when patient was hospitalized for similar syncopal episode.  I do not see any recent vascular studies therefore obtained carotid Dopplers which did not show any critical stenosis.  I have discussed work-up findings over the phone with the patient's daughter who was mainly concerned about his diaphoresis and I explained that this can sometimes accompany vasovagal episodes.  She was concerned about the possibility of UTI, I have ordered a UA.  Will plan to discharge pending UA  results. Final Clinical Impression(s) / ED Diagnoses Final diagnoses:  None    Rx / DC Orders ED Discharge Orders    None       Jenai Scaletta, Ambrose Finland, MD 04/04/20 1700

## 2020-04-04 NOTE — ED Triage Notes (Signed)
Pt bib [facility] due to syncopal episode at home. Pt and daughter were ending a physical therapy session when pt stood, eyes rolled to back of head and lost consciousness.   Pt baseline A & Ox3  EMS reports pt is A&O x1. Responds to voice commands  BP 148/48 HR 76 O2 98% Temp 96.4  18 Left AC placed by EMS

## 2020-04-04 NOTE — Progress Notes (Signed)
Carotid duplex  has been completed. Refer to [facility] under chart review to view preliminary results.   04/04/2020  3:01 PM Edan Serratore, Gerarda Gunther

## 2020-04-14 ENCOUNTER — Other Ambulatory Visit: Payer: Medicare Other | Admitting: Hospice

## 2020-04-14 ENCOUNTER — Other Ambulatory Visit: Payer: Self-pay

## 2020-04-14 DIAGNOSIS — Z515 Encounter for palliative care: Secondary | ICD-10-CM

## 2020-04-14 DIAGNOSIS — F0391 Unspecified dementia with behavioral disturbance: Secondary | ICD-10-CM

## 2020-04-14 NOTE — Progress Notes (Signed)
Therapist, nutritional Palliative Care Consult Note Telephone: [phone]  Fax: [phone].  PATIENT NAME: Rodney Perkins DOB: 04-Nov-1923 MRN: [id number] PRIMARY CARE PROVIDER:Dr. Karl Ito  REFERRING PROVIDER:Dr. Karl Ito RESPONSIBLE PARTY:Rodney Perkins [phone]   RECOMMENDATIONS/PLAN:  Advance Care Planning:  Visit is to build trust and to check up on palliative care and follow up on patient's condition related to reported increasing agitation 2 weeks ago.  Goals of Care: Goals of care include to maximize quality of life and symptom management. Code Status: Patient is a DO  NOT RESUSCITATE.Medical orders scope of treatment  MOST selections  include no feeding tube antibiotics if indicated, IV fluids if indicated, comfort measures, do not transfer to hospital. Both DNR and MOST forms in Epic.Family is open to hospice services in the future when patient qualifies for it. Follow DD:UKGURKYHCW care will continue to follow patient for goals of care clarification and symptom management. Next visit in  6 weeks Symptom management:  For worsening agitation patient was started on 25 mg Seroquel at bedtime.  Rodney Perkins reports agitation well managed with Seroquel; she now gives half the dose-12.5 mg at; effective.  Patient with history of vasovagal syndrome; was seen in the ED 04/04/2020 for same.  EKG CBC and CMP results were  Reassuring; no UTI.  Patient discharged home same day.  No syncope since then.   Ongoing confusion/memory loss related to Dementia, incontinent of bowel and bladder, total care, bed bound FAST 7b.  FLACC 0. Left-sided weakness related to previous stroke is ongoing. Rodney Perkins does exercises and passive range of motion for patient.Patient is able to feed self after set up. He denies pain/discomfort no coughing, no respiratory distress. Patient lives at home with Rodney Perkins.  Patient in no acute distress.  Encouraged ongoing care; also  reiterated need for hydration. Rodney Perkins is  agreeable and already working on that.   I spent40minutes providing this initialconsultation; time iincludes time spent with patient/family, chart review, provider coordination, and documentation. More than 50% of the time in this consultation was spent on coordinating communication  HISTORY OF PRESENT ILLNESS:Rodney Brownis a 86 y.o.year oldmalewith multiple medical problems including stroke with left side weakness, Alzheimer's Dementia, stage 4 kidney disease, vaso vagal syndrome. Palliative Care was asked to help address goals of care.   CODE STATUS:DNR  PPS:30% HOSPICE ELIGIBILITY/DIAGNOSIS: TBD  PAST MEDICAL HISTORY:  Past Medical History:  Diagnosis Date  . Alzheimer's dementia (HCC)   . Stroke (HCC)   . Vagal reaction     SOCIAL HX:  Social History   Tobacco Use  . Smoking status: Not on file  Substance Use Topics  . Alcohol use: Not on file    ALLERGIES: No Known Allergies   PERTINENT MEDICATIONS:  Outpatient Encounter Medications as of 04/14/2020  Medication Sig  . acetaminophen (TYLENOL) 500 MG tablet Take 1,000 mg by mouth as needed for mild pain, moderate pain, fever or headache.  Marland Kitchen aspirin 81 MG chewable tablet Chew 81 mg by mouth daily.  . Cyanocobalamin (VITAMIN B12 PO) Take 1 tablet by mouth daily.  . Multiple Vitamin (MULTIVITAMIN ADULT PO) Take 1 tablet by mouth daily.   No facility-administered encounter medications on file as of 04/14/2020.    PHYSICAL EXAM/ROS:  General: NAD, cooperative Cardiovascular: regular rate and rhythm; denies chest pain Pulmonary: clear ant fields, normal respiratory effort, no coughing, no shortness of breath Abdomen: soft, nontender, + bowel sounds GU: no suprapubic tenderness Extremities: no edema in bilateral lower  extremities; contractures to bilateral hands.   Skin: no rashes to exposed skin Neurological: Weakness but otherwise nonfocal Rodney Carpenter,  NP

## 2020-05-25 ENCOUNTER — Other Ambulatory Visit: Payer: Medicare Other | Admitting: Hospice

## 2020-05-25 ENCOUNTER — Other Ambulatory Visit: Payer: Self-pay

## 2020-05-25 DIAGNOSIS — Z515 Encounter for palliative care: Secondary | ICD-10-CM

## 2020-05-25 DIAGNOSIS — F0391 Unspecified dementia with behavioral disturbance: Secondary | ICD-10-CM

## 2020-05-25 NOTE — Patient Instructions (Signed)
Please out washouts

## 2020-05-25 NOTE — Progress Notes (Signed)
Therapist, nutritional Palliative Care Consult Note Telephone: [phone]  Fax: [phone]  PATIENT NAME: Rodney Perkins DOB: [birth year] MRN: [id number]  PRIMARY CARE PROVIDER:Dr. Karl Ito  REFERRING PROVIDER:Dr. Karl Ito  RESPONSIBLE PARTY:Allison Floyd [phone]  TELEHEALTH VISIT STATEMENT Due to the COVID-19 crisis, this visit was done via telephone from my office. It was initiated and consented to by this patient and/or family.   RECOMMENDATIONS/PLAN:  Advance Care Planning:  Visit consisted of building trust and discussions on Palliative Medicine as specialized medical care for people living with serious illness, aimed at facilitating better quality of life through symptoms relief, assisting with advance care plan and establishing goals of care.  Goals of Care:Goals of care include to maximize quality of life and symptom management.  Code Status:Patient is a DO NOT RESUSCITATE.Medical orders scope of treatmentMOST selections include no feeding tube antibiotics if indicated, IV fluids if indicated, comfort measures, do not transfer to hospital. Both DNR and MOST forms in Epic.Family is open to hospice services in the future when patient qualifies for it. Follow YC:[id number] care will continue to follow patient for goals of care clarification and symptom management. Follow-up visit in 2 months  Symptom management:  Agitation related to dementia is currently under control.  Patient was recently started on Seroquel 25 mg at bedtime, then reduced to 12.5 mg at bedtime.  [name] reports she is no longer giving it to him every day, only when needed.   No significant changes since last visit; no report of fall or hospitalization.  Patient denies pain/discomfort.  For worsening agitation patient was started on 25 mg Seroquel at bedtime.     Patient with history of vasovagal syndrome; was seen in the ED 04/04/2020 for same.  EKG CBC  and CMP results were  Reassuring; no UTI.  Patient discharged home same day.  No syncope since then.    Ongoing confusion/memory loss related to Dementia, incontinent of bowel and bladder, total care, bed bound FAST 7b.  FLACC 0. Left-sided weakness related to previous stroke is ongoing. [name] continues to do exercises and passive range of motion for patient.Patient is able to feed self after set up; no coughing, no choking, no respiratory distress. Patient in no distress.  Encouraged ongoing care.  Caregiver/family/community support: Patient lives at home with [facility].   [name] is the primary caregiver.  Strong family support system identified.  I spent65minutes providing this initialconsultation; time iincludes time spent with patient/family, chart review, provider coordination, and documentation. More than 50% of the time in this consultation was spent on coordinating communication  HISTORY OF PRESENT ILLNESS:Rodney Brownis a 86 y.o.year oldmalewith multiple medical problems including stroke with left side weakness, Alzheimer's Dementia, stage 4 kidney disease, vaso vagal syndrome. Palliative Care was asked to help address goals of care.   CODE STATUS:DNR  PPS:30% HOSPICE ELIGIBILITY/DIAGNOSIS: TBD  PAST MEDICAL HISTORY:  Past Medical History:  Diagnosis Date  . Alzheimer's dementia (HCC)   . Stroke (HCC)   . Vagal reaction     SOCIAL HX:  Social History   Tobacco Use  . Smoking status: Not on file  Substance Use Topics  . Alcohol use: Not on file    ALLERGIES: No Known Allergies   PERTINENT MEDICATIONS:  Outpatient Encounter Medications as of 05/25/2020  Medication Sig  . acetaminophen (TYLENOL) 500 MG tablet Take 1,000 mg by mouth as needed for mild pain, moderate pain, fever or headache.  Marland Kitchen aspirin 81 MG chewable tablet Chew  81 mg by mouth daily.  . Cyanocobalamin (VITAMIN B12 PO) Take 1 tablet by mouth daily.  . Multiple Vitamin (MULTIVITAMIN ADULT  PO) Take 1 tablet by mouth daily.   No facility-administered encounter medications on file as of 05/25/2020.    Rosaura Carpenter, NP

## 2020-07-15 ENCOUNTER — Emergency Department (HOSPITAL_COMMUNITY): Payer: Medicare Other

## 2020-07-15 ENCOUNTER — Emergency Department (HOSPITAL_COMMUNITY)
Admission: EM | Admit: 2020-07-15 | Discharge: 2020-07-16 | Disposition: A | Discharge status: Home / Self Care | Payer: Medicare Other | Attending: Emergency Medicine | Admitting: Emergency Medicine

## 2020-07-15 ENCOUNTER — Encounter (HOSPITAL_COMMUNITY): Payer: Self-pay | Admitting: Emergency Medicine

## 2020-07-15 DIAGNOSIS — Z7982 Long term (current) use of aspirin: Secondary | ICD-10-CM | POA: Diagnosis not present

## 2020-07-15 DIAGNOSIS — R55 Syncope and collapse: Secondary | ICD-10-CM | POA: Insufficient documentation

## 2020-07-15 DIAGNOSIS — R4189 Other symptoms and signs involving cognitive functions and awareness: Secondary | ICD-10-CM

## 2020-07-15 DIAGNOSIS — F0280 Dementia in other diseases classified elsewhere without behavioral disturbance: Secondary | ICD-10-CM | POA: Insufficient documentation

## 2020-07-15 DIAGNOSIS — G309 Alzheimer's disease, unspecified: Secondary | ICD-10-CM | POA: Diagnosis not present

## 2020-07-15 LAB — BASIC METABOLIC PANEL
Anion gap: 12 (ref 5–15)
BUN: 43 mg/dL — ABNORMAL HIGH (ref 8–23)
CO2: 23 mmol/L (ref 22–32)
Calcium: 8.7 mg/dL — ABNORMAL LOW (ref 8.9–10.3)
Chloride: 105 mmol/L (ref 98–111)
Creatinine, Ser: 1.92 mg/dL — ABNORMAL HIGH (ref 0.61–1.24)
GFR, Estimated: 32 mL/min — ABNORMAL LOW (ref >60)
Glucose, Bld: 113 mg/dL — ABNORMAL HIGH (ref 70–99)
Potassium: 4.3 mmol/L (ref 3.5–5.1)
Sodium: 140 mmol/L (ref 135–145)

## 2020-07-15 LAB — CBC
HCT: 38 % — ABNORMAL LOW (ref 39.0–52.0)
Hemoglobin: 11.8 g/dL — ABNORMAL LOW (ref 13.0–17.0)
MCH: 31.8 pg (ref 26.0–34.0)
MCHC: 31.1 g/dL (ref 30.0–36.0)
MCV: 102.4 fL — ABNORMAL HIGH (ref 80.0–100.0)
Platelets: 255 10*3/uL (ref 150–400)
RBC: 3.71 MIL/uL — ABNORMAL LOW (ref 4.22–5.81)
RDW: 12.1 % (ref 11.5–15.5)
WBC: 12 10*3/uL — ABNORMAL HIGH (ref 4.0–10.5)
nRBC: 0 % (ref 0.0–0.2)

## 2020-07-15 LAB — URINALYSIS, ROUTINE W REFLEX MICROSCOPIC
Bacteria, UA: NONE SEEN
Bilirubin Urine: NEGATIVE
Glucose, UA: NEGATIVE mg/dL
Ketones, ur: NEGATIVE mg/dL
Leukocytes,Ua: NEGATIVE
Nitrite: NEGATIVE
Protein, ur: 30 mg/dL — AB
Specific Gravity, Urine: 1.012 (ref 1.005–1.030)
pH: 5 (ref 5.0–8.0)

## 2020-07-15 NOTE — Discharge Instructions (Addendum)
It was our pleasure to provide your ER care today - we hope that you feel better.  Rest. Drink plenty of fluids.  Follow up with your doctor in the coming week.   Return to ER if worse, new symptoms, fevers, trouble breathing, new or severe pain, vomiting, or other concern.

## 2020-07-15 NOTE — ED Notes (Signed)
Pt had large copious watery bowel movement.

## 2020-07-15 NOTE — ED Notes (Addendum)
Pt BP 51/31. Denton Lank, MD notified and is at bedside. 1L NS Bolus hung.

## 2020-07-15 NOTE — ED Provider Notes (Addendum)
[facility] EMERGENCY DEPARTMENT Provider Note   CSN: [id number] Arrival date & time: 07/15/20  1854     History Chief Complaint  Patient presents with  . Loss of Consciousness    Rodney Perkins is a 86 y.o. male.  Patient with hx advanced dementia, prior cva, presents via EMS from home after possible syncopal episode. Patient was in recliner, was noted to close eyes, briefly unresponsive for ~ 15 seconds. Did not fall from chair.   No seizure activity. No incontinence. No other recent similar episodes although does have hx syncope w standing. No reported acute or recent illness, trauma or fall. No fevers. Has been eating/drinking. Pt is not verbally responsive and with right sided contractures/weakness at baseline - level 5 caveat.   The history is provided by the patient and the EMS personnel. The history is limited by the condition of the patient.  Loss of Consciousness      Past Medical History:  Diagnosis Date  . Alzheimer's dementia (HCC)   . Stroke (HCC)   . Vagal reaction     Patient Active Problem List   Diagnosis Date Noted  . Syncope, vasovagal 09/28/2019  . AKI (acute kidney injury) (HCC) 09/28/2019  . Syncope 09/28/2019    History reviewed. No pertinent surgical history.     History reviewed. No pertinent family history.  Social History   Tobacco Use  . Smoking status: Not on file  Substance Use Topics  . Alcohol use: Not on file  . Drug use: Not on file    Home Medications Prior to Admission medications   Medication Sig Start Date End Date Taking? Authorizing Provider  acetaminophen (TYLENOL) 500 MG tablet Take 1,000 mg by mouth as needed for mild pain, moderate pain, fever or headache.    [provider]  aspirin 81 MG chewable tablet Chew 81 mg by mouth daily.    [provider]  Cyanocobalamin (VITAMIN B12 PO) Take 1 tablet by mouth daily.    [provider]  Multiple Vitamin (MULTIVITAMIN ADULT PO)  Take 1 tablet by mouth daily.    [provider]    Allergies    Patient has no known allergies.  Review of Systems   Review of Systems  Unable to perform ROS: Patient nonverbal  Cardiovascular: Positive for syncope.  level 5 caveat, dementia, not verbally responsive at baseline.     Physical Exam Updated Vital Signs BP (!) 122/45   Pulse (!) 115   Temp 98 F (36.7 C) (Rectal)   Resp (!) 24   SpO2 (!) 86%   Physical Exam Vitals and nursing note reviewed.  Constitutional:      Appearance: Normal appearance. He is well-developed.  HENT:     Head: Atraumatic.     Nose: Nose normal.     Mouth/Throat:     Mouth: Mucous membranes are moist.     Pharynx: Oropharynx is clear.  Eyes:     General: No scleral icterus.    Conjunctiva/sclera: Conjunctivae normal.     Pupils: Pupils are equal, round, and reactive to light.  Neck:     Vascular: No carotid bruit.     Trachea: No tracheal deviation.     Comments: No stiffness or rigidity.  Cardiovascular:     Rate and Rhythm: Normal rate and regular rhythm.     Pulses: Normal pulses.     Heart sounds: Normal heart sounds. No murmur heard.  No friction rub. No gallop.  Pulmonary:     Effort: Pulmonary effort is normal. No accessory muscle usage or respiratory distress.     Breath sounds: Normal breath sounds.  Abdominal:     General: Bowel sounds are normal. There is no distension.     Palpations: Abdomen is soft.     Tenderness: There is no abdominal tenderness. There is no guarding.  Genitourinary:    Comments: No cva tenderness. Musculoskeletal:        General: No swelling or tenderness.     Cervical back: Normal range of motion and neck supple. No rigidity.  Skin:    General: Skin is warm and dry.     Findings: No rash.  Neurological:     Mental Status: He is alert.     Comments: Sitting upright. Alert. Content. RUE contracture/chronic. Moves left side purposefully.   Psychiatric:        Mood and Affect:  Mood normal.     ED Results / Procedures / Treatments   Labs (all labs ordered are listed, but only abnormal results are displayed) Results for orders placed or performed during the hospital encounter of 07/15/20  CBC  Result Value Ref Range   WBC 12.0 (H) 4.0 - 10.5 K/uL   RBC 3.71 (L) 4.22 - 5.81 MIL/uL   Hemoglobin 11.8 (L) 13.0 - 17.0 g/dL   HCT 09.3 (L) 39 - 52 %   MCV 102.4 (H) 80.0 - 100.0 fL   MCH 31.8 26.0 - 34.0 pg   MCHC 31.1 30.0 - 36.0 g/dL   RDW 26.7 12.4 - 58.0 %   Platelets 255 150 - 400 K/uL   nRBC 0.0 0.0 - 0.2 %  Basic metabolic panel  Result Value Ref Range   Sodium 140 135 - 145 mmol/L   Potassium 4.3 3.5 - 5.1 mmol/L   Chloride 105 98 - 111 mmol/L   CO2 23 22 - 32 mmol/L   Glucose, Bld 113 (H) 70 - 99 mg/dL   BUN 43 (H) 8 - 23 mg/dL   Creatinine, Ser 9.98 (H) 0.61 - 1.24 mg/dL   Calcium 8.7 (L) 8.9 - 10.3 mg/dL   GFR, Estimated 32 (L) >60 mL/min   Anion gap 12 5 - 15  Urinalysis, Routine w reflex microscopic Urine, Catheterized  Result Value Ref Range   Color, Urine YELLOW YELLOW   APPearance CLEAR CLEAR   Specific Gravity, Urine 1.012 1.005 - 1.030   pH 5.0 5.0 - 8.0   Glucose, UA NEGATIVE NEGATIVE mg/dL   Hgb urine dipstick SMALL (A) NEGATIVE   Bilirubin Urine NEGATIVE NEGATIVE   Ketones, ur NEGATIVE NEGATIVE mg/dL   Protein, ur 30 (A) NEGATIVE mg/dL   Nitrite NEGATIVE NEGATIVE   Leukocytes,Ua NEGATIVE NEGATIVE   RBC / HPF 0-5 0 - 5 RBC/hpf   WBC, UA 0-5 0 - 5 WBC/hpf   Bacteria, UA NONE SEEN NONE SEEN   Mucus PRESENT    Hyaline Casts, UA PRESENT     EKG EKG Interpretation  Date/Time:  Friday July 15 2020 19:24:41 EST Ventricular Rate:  102 PR Interval:    QRS Duration: 98 QT Interval:  364 QTC Calculation: 475 R Axis:   -56 Text Interpretation: Sinus rhythm Left anterior fascicular block Artifact in lead(s) I II III aVR aVL V1 V2 Confirmed by Cathren Laine ([id number]) on 07/15/2020 7:27:32 PM   Radiology DG Chest Port 1  View  Result Date: 07/15/2020 CLINICAL DATA:  Weakness EXAM: PORTABLE CHEST 1 VIEW COMPARISON:  09/28/2019 FINDINGS:  The heart size and mediastinal contours are within normal limits. Both lungs are clear. The visualized skeletal structures are unremarkable. IMPRESSION: No active disease. Electronically Signed   By: Katherine Mantle M.D.   On: 07/15/2020 22:35    Procedures Procedures (including critical care time)  Medications Ordered in ED Medications - No data to display  ED Course  I have reviewed the triage vital signs and the nursing notes.  Pertinent labs & imaging results that were available during my care of the patient were reviewed by me and considered in my medical decision making (see chart for details).    MDM Rules/Calculators/A&P                          Continuous pulse ox and cardiac monitoring. Stat labs. Ecg.   Reviewed nursing notes and prior charts for additional history.  ED visit 03/2020 with syncope reviewed.  Pt is followed by palliative care, DNR wishes, and goals of care being symptom management/comfort.   Labs reviewed/interpreted by me - chem normal  Recheck, remains in nsr on monitor. No dysrhythmia.   RN documents low bp - on checking pt, no change in exam, alert, content appearing, strong pulses - asked for repeat/manual, bp normal. At times, pulse ox not picking up well (no waveform) - when adjusted/correlating, room air pulse ox mid 90s. Rectal temp checked - afebrile. Await CXR.  CXR reviewed/interpreted by me - no pna.   Recheck 2245, bp 122/57, hr 94, rr 16, room air pulse ox 95%.    Given advanced age, co-morbidities, advanced dementia, goals of care, etc. Pt w very limited longer term prognosis, however currently is breathing comfortably, and does not appear in any pain or discomfort, pt currently appears stable for d/c.   Rec pcp f/u.  Return precautions provided.       Final Clinical Impression(s) / ED Diagnoses Final diagnoses:   Unresponsive episode  Syncope, unspecified syncope type    Rx / DC Orders ED Discharge Orders    None           Cathren Laine, MD 07/15/20 2325

## 2020-07-15 NOTE — ED Triage Notes (Signed)
Pt arrives by EMS from home with complaints of syncopal episode lasting about 15 seconds. Pt was in recliner when daughter witnessed pt eyes roll back and became unresponsive..Pt has history of vaso vagal episodes . Pt non verbal at baseline with right sided deficits from previous stroke

## 2020-07-15 NOTE — ED Notes (Signed)
Daughter [name] wants nurse to call her with an update if possible. Call back number [phone]

## 2020-07-16 NOTE — ED Notes (Signed)
Physician notified of low blood pressure.

## 2020-07-16 NOTE — ED Notes (Signed)
Ptar called 

## 2020-07-16 NOTE — ED Provider Notes (Signed)
Called to see patient because of low blood pressure.  It is noted that he did have transient hypotension earlier in his ED visit, decision made to discharge based on DNR status and desire for comfort care.  Blood pressure is repeated and has come back up, but is still low.  Will observe and repeat blood pressure and decide on course based on what his blood pressure does.  Blood pressure has stabilized, will discharge.   Dione Booze, MD 07/16/20 2248

## 2020-07-21 ENCOUNTER — Other Ambulatory Visit: Payer: Medicare Other | Admitting: Hospice

## 2020-07-21 ENCOUNTER — Other Ambulatory Visit: Payer: Self-pay

## 2020-07-21 DIAGNOSIS — F0391 Unspecified dementia with behavioral disturbance: Secondary | ICD-10-CM

## 2020-07-21 DIAGNOSIS — Z515 Encounter for palliative care: Secondary | ICD-10-CM

## 2020-07-21 NOTE — Progress Notes (Signed)
Therapist, nutritional Palliative Care Consult Note Telephone: [phone]  Fax: [phone]  PATIENT NAME: Rodney Perkins DOB: 1923/11/26 MRN: [id number]  PRIMARY CARE PROVIDER:Dr. Karl Ito  REFERRING PROVIDER:Dr. Karl Ito  RESPONSIBLE PARTY:Allison Adela Lank [phone]    RECOMMENDATIONS/PLAN:  Advance Care Planning: Visit consisted of building trust and follow up on palliative care.  Goals of Care:Goals of care include to maximize quality of life and symptom management.  Code Status:Patient is a DO NOT RESUSCITATE.Medical orders scope of treatmentMOST selections include no feeding tube antibiotics if indicated, IV fluids if indicated, comfort measures, do not transfer to hospital. Both DNR and MOST forms in Epic. [name] reported that patient received a hospice referral and may be admitted to hospice service by tomorrow.  She said she is expecting a representative from hosp[facility] of [place] tomorrow for possible admission.  Ample emotional support provided.  Palliative service will be available to patient if hospice admission does not go through.   Symptom management: Patient was seen in the ED For unresponsive episode possibly related to low blood pressure//transient hypotension. Decision was made to discharge patient home based on DNR status and desire for comfort care.  No changes to his medication. Patient denies pain/discomfort.Patient with history of vasovagal syndrome;was seen in the ED 04/04/2020 for same. EKG CBC and CMP results wereReassuring;no UTI. Patient discharged home same day. No syncope since then.  Ongoing confusion/memory loss related to Dementia, incontinent of bowel and bladder, total care, bed bound FAST 7b.FLACC 0. Left-sided weakness related to previous stroke. Encouraged ongoing care.  Caregiver/family/community support: Patient lives at home with [facility].  [name] is the primary caregiver.   Strong family support system identified.  I spent84minutes providing this consultation; time includes time spent with patient/family, chart review, provider coordination, and documentation. More than 50% of the time in this consultation was spent on coordinating communication  HISTORY OF PRESENT ILLNESS:Rodney Brownis a 86 y.o.year oldmalewith multiple medical problems including stroke with left side weakness, Alzheimer's Dementia, stage 4 kidney disease, vaso vagal syndrome. Palliative Care was asked to help address goals of care.   CODE STATUS:DNR  HOSPICE ELIGIBILITY/DIAGNOSIS: TBD  PAST MEDICAL HISTORY:  Past Medical History:  Diagnosis Date  . Alzheimer's dementia (HCC)   . Stroke (HCC)   . Vagal reaction     SOCIAL HX:  Social History   Tobacco Use  . Smoking status: Not on file  . Smokeless tobacco: Not on file  Substance Use Topics  . Alcohol use: Not on file    ALLERGIES: No Known Allergies   PERTINENT MEDICATIONS:  Outpatient Encounter Medications as of 07/21/2020  Medication Sig  . acetaminophen (TYLENOL) 500 MG tablet Take 1,000 mg by mouth as needed for mild pain, moderate pain, fever or headache.  Marland Kitchen aspirin 81 MG chewable tablet Chew 81 mg by mouth daily.  . Cyanocobalamin (VITAMIN B12 PO) Take 1 tablet by mouth daily.  . Multiple Vitamin (MULTIVITAMIN ADULT PO) Take 1 tablet by mouth daily.   No facility-administered encounter medications on file as of 07/21/2020.    PHYSICAL EXAM/ROS  General: NAD,cooperative Cardiovascular: regular rate and rhythm;denies chest pain Pulmonary: clear ant fields,normal respiratory effort, no coughing, no shortness of breath Abdomen: soft, nontender, + bowel sounds GU: no suprapubic tenderness Extremities: no edema in bilateral lower extremities; contractures to bilateral hands.   Skin: no rashesto exposed skin Neurological: Weakness but otherwise nonfocal  Note:  Portions of this note were generated with  Scientist, clinical (histocompatibility and immunogenetics). Dictation  errors may occur despite attempts at proofreading.  Rosaura Carpenter, NP

## 2021-04-11 IMAGING — CT CT HEAD W/O CM
4 series · 16 of 47 positions shown, 18 images · non-contrast
Comparison: None.

CLINICAL DATA: Neuro deficit(s), subacute Neuro deficit, acute,
stroke suspected

EXAM:
CT HEAD WITHOUT CONTRAST
TECHNIQUE: Contiguous axial images were obtained from the base of the skull
through the vertex without intravenous contrast.

[Series 3: head wo · axial · 0.43mm/px · z∈[+1251,+1371]mm · 7 of 34 slices shown, 9 images]
[im 5/34  brain]
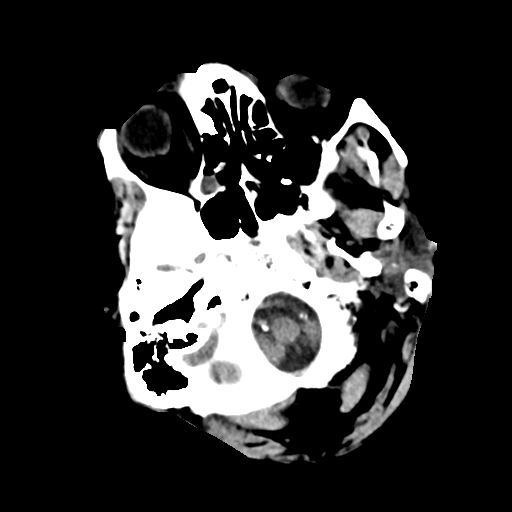
[im 5/34  bone]
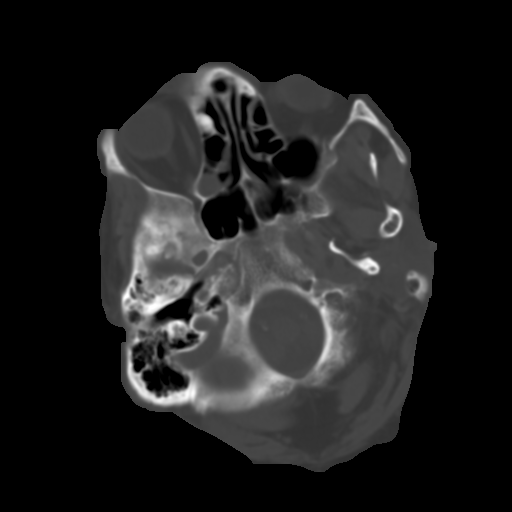
[im 9/34  brain]
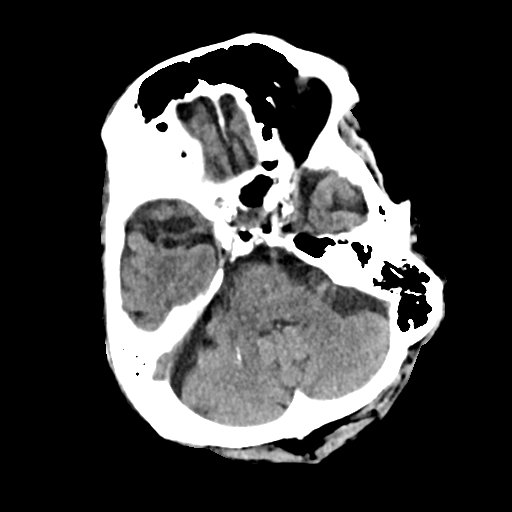
[im 13/34  brain]
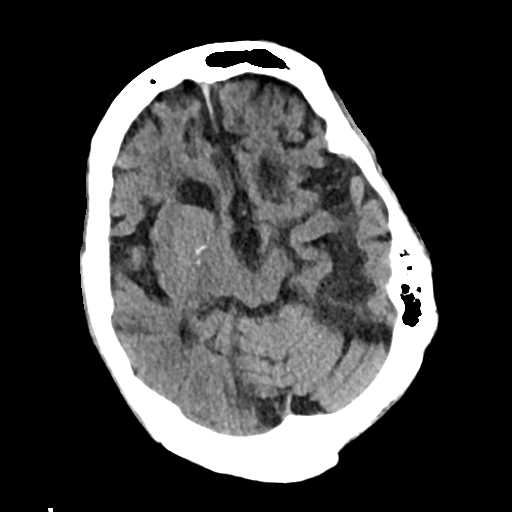
[im 17/34  brain]
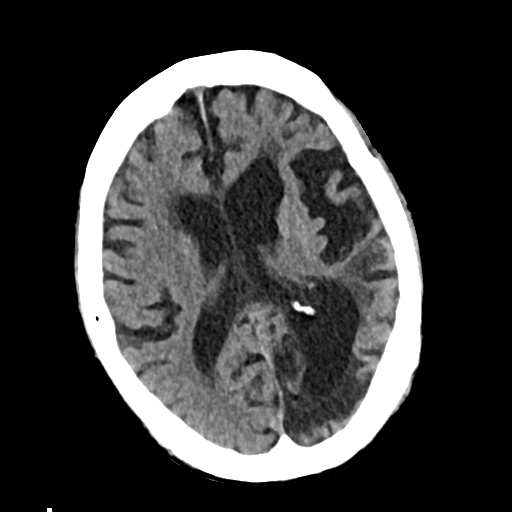
[im 21/34  brain]
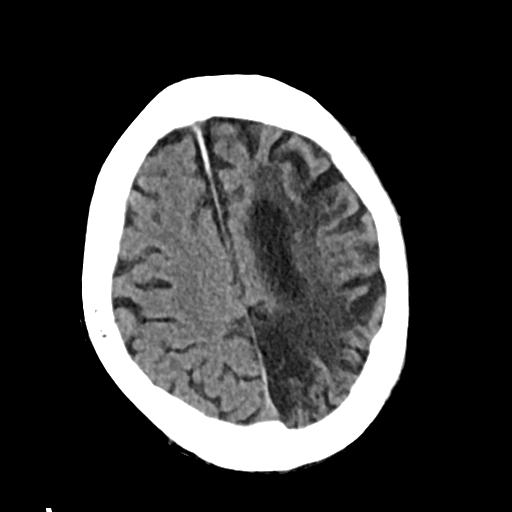
[im 21/34  bone]
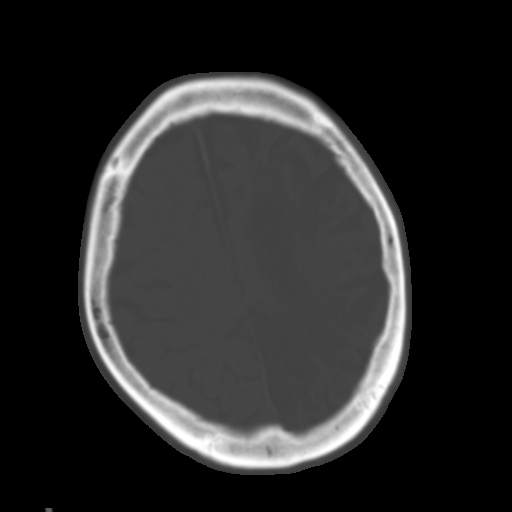
[im 25/34  brain]
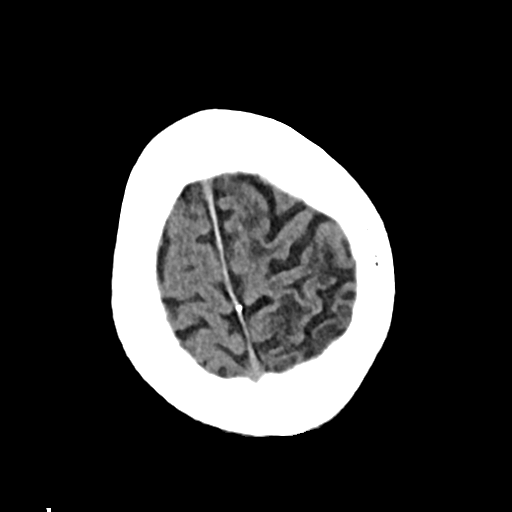
[im 29/34  brain]
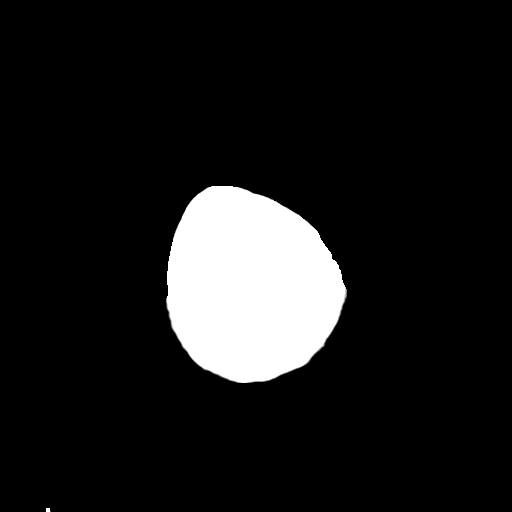

[Series 4: head bone · axial · 0.43mm/px · z∈[+1247,+1279]mm · 3 of 83 slices shown]
[im 9/83  bone]
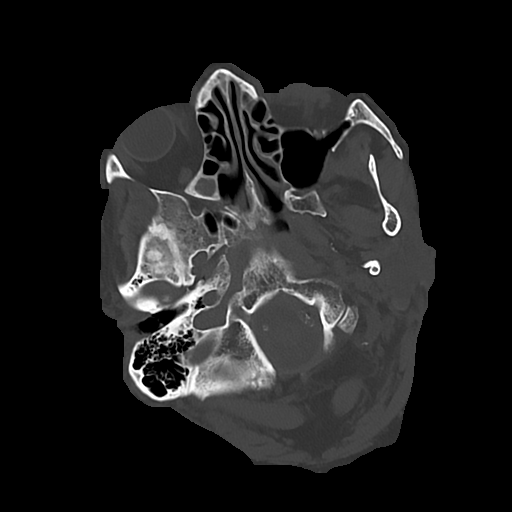
[im 17/83  bone]
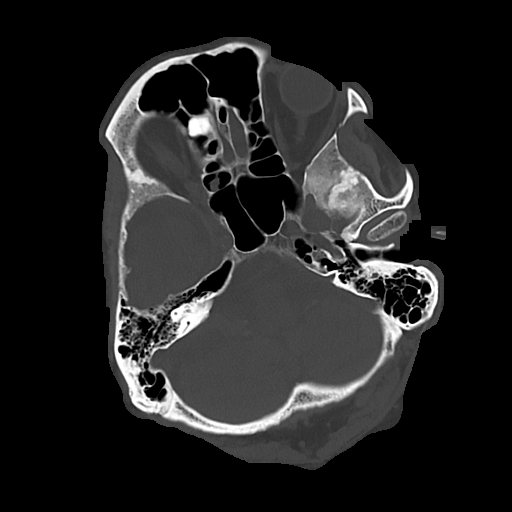
[im 25/83  bone]
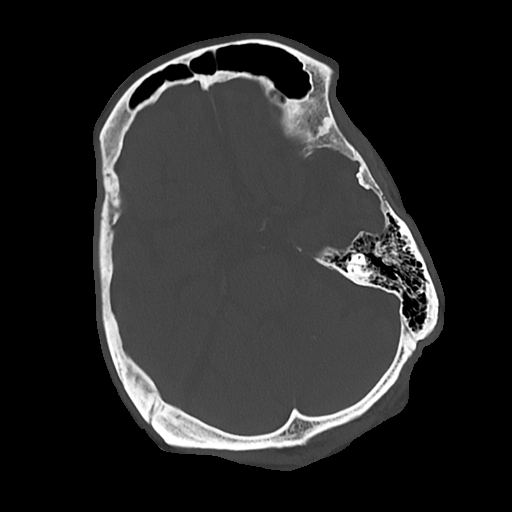

[Series 5: cor soft · coronal · 0.32mm/px · 3 of 74 slices shown]
[im 25/74  brain]
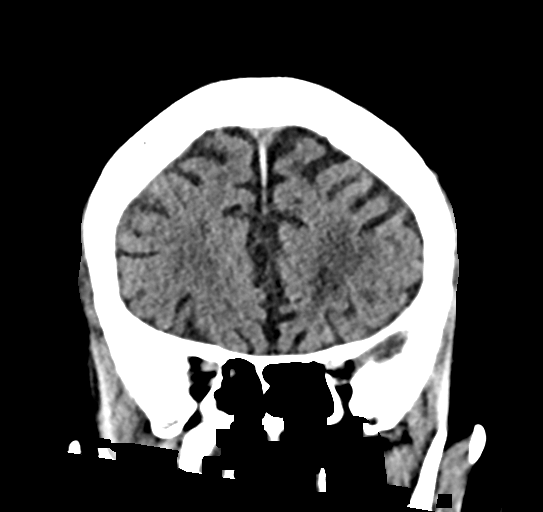
[im 33/74  brain]
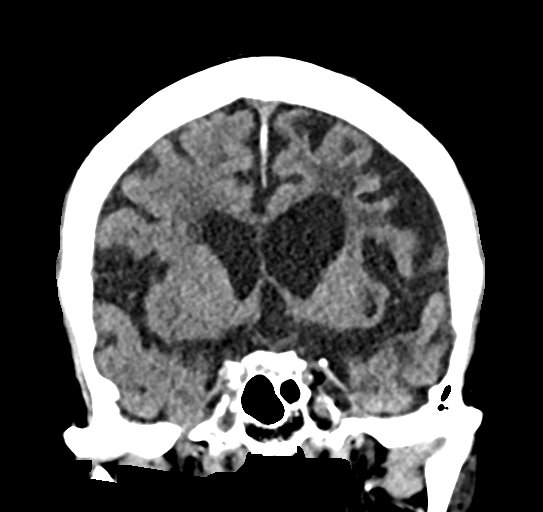
[im 41/74  brain]
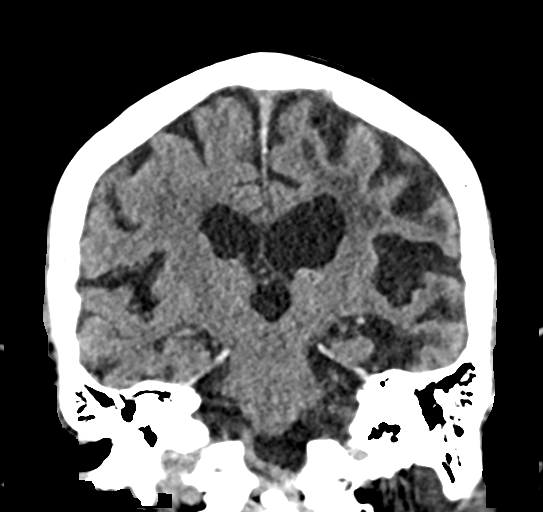

[Series 6: sag soft · sagittal · 0.32mm/px · 3 of 61 slices shown]
[im 23/61  brain]
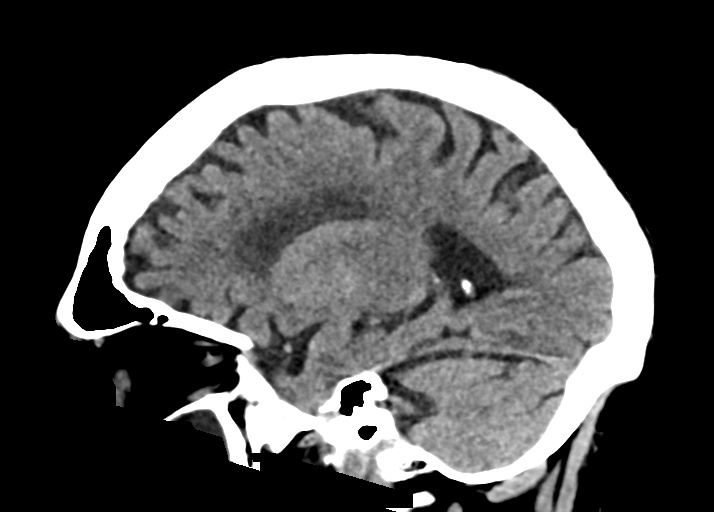
[im 31/61  brain]
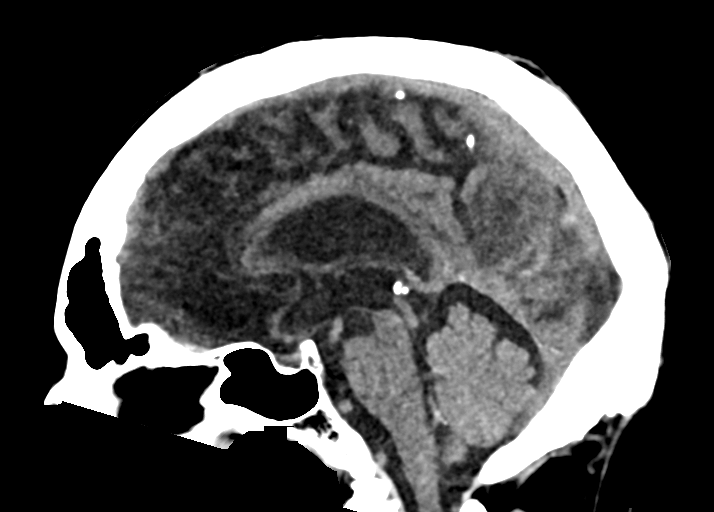
[im 38/61  brain]
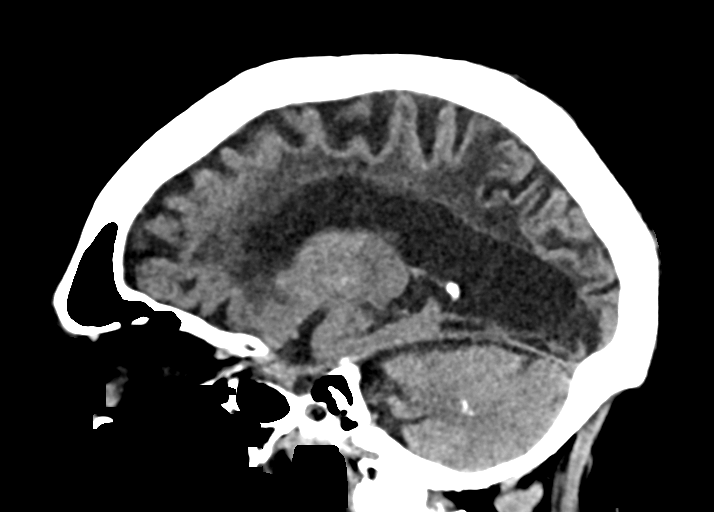

[16 of 47 positions shown; findings below may reference images not displayed]

FINDINGS: Brain: There is severe generalized atrophy a large area
encephalomalacia at the site of a remote left posterior cerebral
artery territory infarct. There is ex vacuo dilatation ventricles.
No intracranial hemorrhage or extra-axial collection. No evidence of
acute cortical infarct.

Vascular: There is atherosclerotic calcification of the internal
carotid and vertebral arteries at the skull base.

Skull: Normal. Negative for fracture or focal lesion.

Sinuses/Orbits: Moderate right maxillary sinus mucosal thickening.
Dense osteoma in the anterior right ethmoid.

Other: None
IMPRESSION: 1. No acute intracranial abnormality.
2. Old left posterior cerebral artery territory infarct and severe
generalized atrophy.

## 2021-10-11 DEATH — deceased

## 2022-01-26 IMAGING — DX DG CHEST 1V PORT
1 series · 1 of 1 positions shown · non-contrast
Comparison: 09/28/2019

CLINICAL DATA: Weakness

EXAM:
PORTABLE CHEST 1 VIEW

[chest]
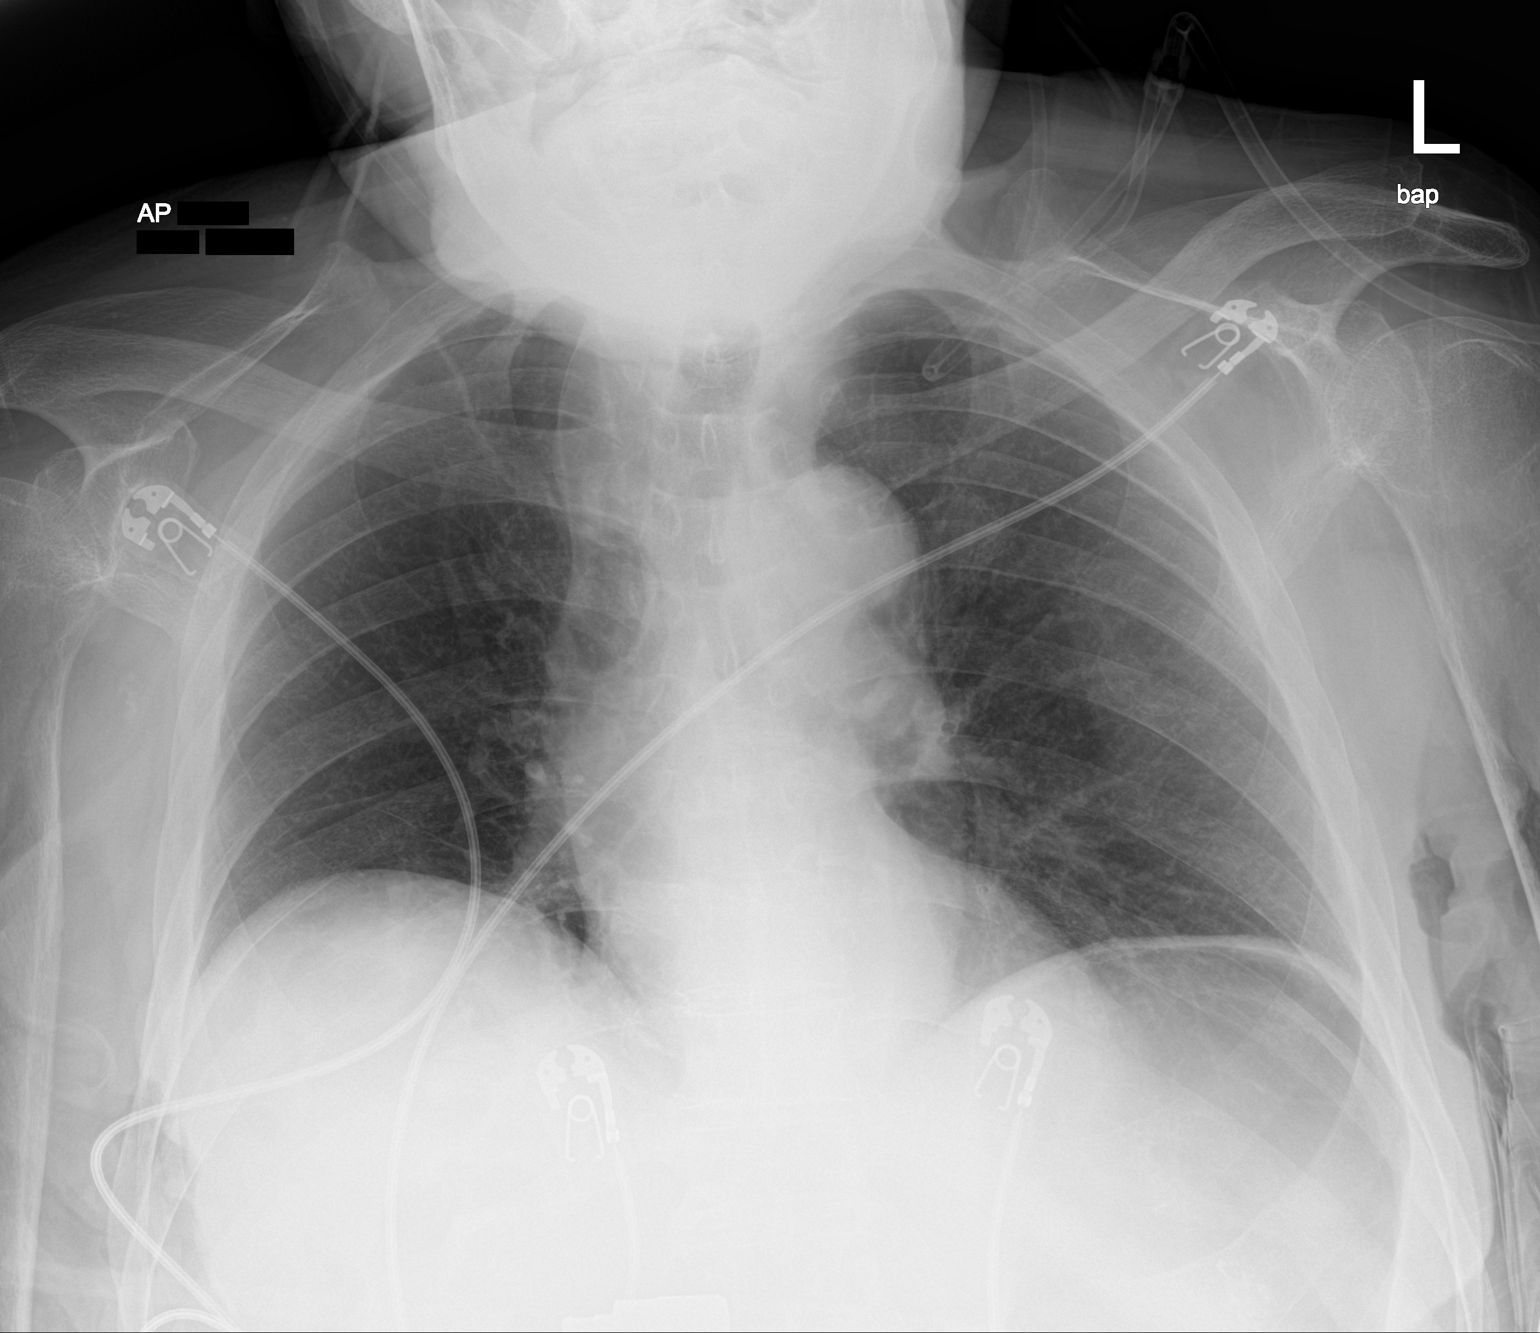

[1 of 1 positions shown; findings below may reference images not displayed]

FINDINGS: The heart size and mediastinal contours are within normal limits.
Both lungs are clear. The visualized skeletal structures are
unremarkable.
IMPRESSION: No active disease.
